# Patient Record
Sex: Female | Born: 1970 | Race: Black or African American | Hispanic: No | Marital: Single | Smoking: Current every day smoker
Health system: Southern US, Community
[De-identification: ages and names within clinical notes are randomized; demographics above are authoritative.]

## PROBLEM LIST (undated history)

## (undated) DIAGNOSIS — O009 Unspecified ectopic pregnancy without intrauterine pregnancy: Secondary | ICD-10-CM

## (undated) HISTORY — PX: ABDOMINAL HYSTERECTOMY: SHX81

---

## 2005-12-11 ENCOUNTER — Emergency Department: Payer: Self-pay | Admitting: Emergency Medicine

## 2011-07-21 ENCOUNTER — Emergency Department: Payer: Self-pay | Admitting: Emergency Medicine

## 2011-08-04 ENCOUNTER — Emergency Department: Payer: Self-pay | Admitting: Emergency Medicine

## 2012-06-03 ENCOUNTER — Emergency Department: Payer: Self-pay | Admitting: Emergency Medicine

## 2014-04-10 ENCOUNTER — Emergency Department: Payer: Self-pay | Admitting: Emergency Medicine

## 2014-04-10 LAB — CBC
HCT: 37.9 % (ref 35.0–47.0)
HGB: 12.8 g/dL (ref 12.0–16.0)
MCH: 31.5 pg (ref 26.0–34.0)
MCHC: 33.8 g/dL (ref 32.0–36.0)
MCV: 93 fL (ref 80–100)
PLATELETS: 280 10*3/uL (ref 150–440)
RBC: 4.06 10*6/uL (ref 3.80–5.20)
RDW: 12.4 % (ref 11.5–14.5)
WBC: 15.4 10*3/uL — ABNORMAL HIGH (ref 3.6–11.0)

## 2014-04-10 LAB — URINALYSIS, COMPLETE
Bilirubin,UR: NEGATIVE
Glucose,UR: NEGATIVE mg/dL (ref 0–75)
KETONE: NEGATIVE
Leukocyte Esterase: NEGATIVE
Nitrite: NEGATIVE
PROTEIN: NEGATIVE
Ph: 6 (ref 4.5–8.0)
RBC,UR: 1 /HPF (ref 0–5)
Specific Gravity: 1.005 (ref 1.003–1.030)
Squamous Epithelial: 4
WBC UR: 1 /HPF (ref 0–5)

## 2014-04-10 LAB — BASIC METABOLIC PANEL
Anion Gap: 6 — ABNORMAL LOW (ref 7–16)
BUN: 10 mg/dL (ref 7–18)
CHLORIDE: 105 mmol/L (ref 98–107)
CO2: 27 mmol/L (ref 21–32)
Calcium, Total: 9.1 mg/dL (ref 8.5–10.1)
Creatinine: 0.96 mg/dL (ref 0.60–1.30)
Glucose: 84 mg/dL (ref 65–99)
Osmolality: 274 (ref 275–301)
Potassium: 4.2 mmol/L (ref 3.5–5.1)
SODIUM: 138 mmol/L (ref 136–145)

## 2014-04-10 LAB — TROPONIN I

## 2016-02-14 DIAGNOSIS — M2550 Pain in unspecified joint: Secondary | ICD-10-CM | POA: Insufficient documentation

## 2016-02-14 DIAGNOSIS — F430 Acute stress reaction: Secondary | ICD-10-CM | POA: Insufficient documentation

## 2016-02-14 DIAGNOSIS — F5102 Adjustment insomnia: Secondary | ICD-10-CM | POA: Insufficient documentation

## 2016-02-14 DIAGNOSIS — E669 Obesity, unspecified: Secondary | ICD-10-CM | POA: Insufficient documentation

## 2016-03-07 ENCOUNTER — Other Ambulatory Visit: Payer: Self-pay | Admitting: Pediatrics

## 2016-03-07 DIAGNOSIS — Z1231 Encounter for screening mammogram for malignant neoplasm of breast: Secondary | ICD-10-CM

## 2016-03-08 ENCOUNTER — Ambulatory Visit
Admission: RE | Admit: 2016-03-08 | Discharge: 2016-03-08 | Disposition: A | Discharge status: Home / Self Care | Payer: BLUE CROSS/BLUE SHIELD | Source: Ambulatory Visit | Attending: Pediatrics | Admitting: Pediatrics

## 2016-03-08 DIAGNOSIS — Z1231 Encounter for screening mammogram for malignant neoplasm of breast: Secondary | ICD-10-CM | POA: Insufficient documentation

## 2016-10-12 ENCOUNTER — Ambulatory Visit
Admission: EM | Admit: 2016-10-12 | Discharge: 2016-10-12 | Disposition: A | Discharge status: Home / Self Care | Payer: BLUE CROSS/BLUE SHIELD | Attending: Family | Admitting: Family

## 2016-10-12 DIAGNOSIS — K047 Periapical abscess without sinus: Secondary | ICD-10-CM | POA: Diagnosis not present

## 2016-10-12 HISTORY — DX: Unspecified ectopic pregnancy without intrauterine pregnancy: O00.90

## 2016-10-12 MED ORDER — NAPROXEN 500 MG PO TABS: 500.0000 mg | ORAL_TABLET | Freq: Two times a day (BID) | ORAL | 0 refills | Status: DC | PRN

## 2016-10-12 MED ORDER — AMOXICILLIN-POT CLAVULANATE 875-125 MG PO TABS: 1.0000 | ORAL_TABLET | Freq: Two times a day (BID) | ORAL | 0 refills | Status: AC

## 2016-10-12 NOTE — ED Triage Notes (Signed)
Patient states that she woke up this morning with the left side of her face swollen. Patient states that she has noticed some redness around a tooth but has not had a lot of pain with the tooth.

## 2016-10-12 NOTE — ED Provider Notes (Signed)
CSN: [id number]     Arrival date & time 10/12/16  1218 History   First MD Initiated Contact with Patient 10/12/16 1248     Chief Complaint  Patient presents with  . Facial Swelling   (Consider location/radiation/quality/duration/timing/severity/associated sxs/prior Treatment) 45 year old female presents with left sided facial swelling and pain that started this morning. Her teeth are in poor repair and she is uncertain if there is redness/infection along her gumline on her left lower molar area. She is also having some nasal congestion and pain traveling from her ear to her lip on the left side. She has taken Ibuprofen with some relief. She denies any fever or GI symptoms. She has not been to a dentist in over 2 years. Her last experience did not go well and they pulled multiple teeth on her left side. She smokes daily but has no other chronic health issues and takes no daily medication.    The history is provided by the patient.    Past Medical History:  Diagnosis Date  . Ectopic pregnancy    Past Surgical History:  Procedure Laterality Date  . ABDOMINAL HYSTERECTOMY    . CESAREAN SECTION     History reviewed. No pertinent family history. Social History  Substance Use Topics  . Smoking status: Current Every Day Smoker    Packs/day: 1.00    Types: Cigars  . Smokeless tobacco: Never Used  . Alcohol use No   OB History    No data available     Review of Systems  Constitutional: Positive for appetite change. Negative for chills, fatigue and fever.  HENT: Positive for congestion, dental problem, facial swelling and postnasal drip. Negative for ear discharge, ear pain, rhinorrhea, sinus pain, sinus pressure, sneezing and sore throat.   Respiratory: Negative for cough and shortness of breath.   Gastrointestinal: Negative for diarrhea, nausea and vomiting.  Musculoskeletal: Negative for neck pain and neck stiffness.  Skin: Negative for rash.  Neurological: Positive for headaches.  Negative for weakness, light-headedness and numbness.  Hematological: Positive for adenopathy.    Allergies  Patient has no known allergies.  Home Medications   Prior to Admission medications   Medication Sig Start Date End Date Taking? Authorizing Provider  amoxicillin-clavulanate (AUGMENTIN) 875-125 MG tablet Take 1 tablet by mouth every 12 (twelve) hours. 10/12/16 10/22/16  Sudie GrumblingAnn Berry Myrl Bynum, NP  naproxen (NAPROSYN) 500 MG tablet Take 1 tablet (500 mg total) by mouth 2 (two) times daily as needed for moderate pain. 10/12/16   Sudie GrumblingAnn Berry Ardie Mclennan, NP   Meds Ordered and Administered this Visit  Medications - No data to display  BP (!) 155/88 (BP Location: Left Arm)   Pulse 100   Temp 98.2 F (36.8 C) (Oral)   Resp 17   Ht 5\' 4"  (1.626 m)   Wt 230 lb (104.3 kg)   SpO2 100%   BMI 39.48 kg/m  No data found.   Physical Exam  Constitutional: She is oriented to person, place, and time. She appears well-developed and well-nourished. No distress.  HENT:  Head: Normocephalic and atraumatic.  Right Ear: Hearing, tympanic membrane, external ear and ear canal normal.  Left Ear: Hearing, tympanic membrane, external ear and ear canal normal.  Nose: Rhinorrhea present. Right sinus exhibits no maxillary sinus tenderness and no frontal sinus tenderness. Left sinus exhibits no maxillary sinus tenderness and no frontal sinus tenderness.  Mouth/Throat: Uvula is midline, oropharynx is clear and moist and mucous membranes are normal. Abnormal dentition. Dental abscesses  and dental caries present. No uvula swelling. No posterior oropharyngeal erythema.    Redness and swelling below and lateral to 1st molar on left lower area. Very tender. No distinct discharge but possible pus present deep below gumline. Multiple teeth missing on left side- upper and lower areas and teeth in poor repair on right side.   Neck: Normal range of motion. Neck supple.  Cardiovascular: Normal rate, regular rhythm and normal  heart sounds.   Pulmonary/Chest: Effort normal and breath sounds normal. No respiratory distress. She has no decreased breath sounds. She has no wheezes.  Lymphadenopathy:       Head (left side): Submandibular adenopathy present.    She has cervical adenopathy.       Right cervical: No superficial cervical and no deep cervical adenopathy present.      Left cervical: Superficial cervical adenopathy present. No deep cervical adenopathy present.  Neurological: She is alert and oriented to person, place, and time. No cranial nerve deficit or sensory deficit.  Skin: Skin is warm and dry.  Psychiatric: She has a normal mood and affect. Her behavior is normal. Judgment and thought content normal.    Urgent Care Course   Clinical Course     Procedures (including critical care time)  Labs Review Labs Reviewed - No data to display  Imaging Review No results found.   Visual Acuity Review  Right Eye Distance:   Left Eye Distance:   Bilateral Distance:    Right Eye Near:   Left Eye Near:    Bilateral Near:         MDM   1. Dental infection    Discussed that she may have an abscess- recommend start Augmentin 875mg  twice a day for 10 days. Take Naproxen 500mg  every 12 hours as needed for pain. Apply cool compresses to area for comfort. Information provided on 2 local dentists- recommend she call today to schedule appointment for follow-up for next week. If symptoms or pain worsens, go to ER.     Sudie GrumblingAnn Berry Ayven Pheasant, NP 10/13/16 [phone]

## 2016-10-12 NOTE — Discharge Instructions (Signed)
Start Augmentin twice a day as directed. May use Naproxen every 12 hours as needed for pain. May apply cool compresses to area for comfort. Follow-up with a dentist next week.

## 2017-03-23 ENCOUNTER — Ambulatory Visit
Admission: EM | Admit: 2017-03-23 | Discharge: 2017-03-23 | Disposition: A | Discharge status: Home / Self Care | Payer: BLUE CROSS/BLUE SHIELD | Attending: Family Medicine | Admitting: Family Medicine

## 2017-03-23 DIAGNOSIS — A599 Trichomoniasis, unspecified: Secondary | ICD-10-CM

## 2017-03-23 DIAGNOSIS — L235 Allergic contact dermatitis due to other chemical products: Secondary | ICD-10-CM

## 2017-03-23 LAB — URINALYSIS, COMPLETE (UACMP) WITH MICROSCOPIC
Bilirubin Urine: NEGATIVE
Glucose, UA: NEGATIVE mg/dL
Ketones, ur: NEGATIVE mg/dL
Nitrite: NEGATIVE
PROTEIN: NEGATIVE mg/dL
SPECIFIC GRAVITY, URINE: 1.025 (ref 1.005–1.030)
pH: 6.5 (ref 5.0–8.0)

## 2017-03-23 MED ORDER — TRIAMCINOLONE ACETONIDE 0.1 % EX CREA: 1.0000 "application " | TOPICAL_CREAM | Freq: Two times a day (BID) | CUTANEOUS | 0 refills | Status: DC

## 2017-03-23 MED ORDER — METRONIDAZOLE 500 MG PO TABS: ORAL_TABLET | ORAL | 0 refills | Status: DC

## 2017-03-23 NOTE — ED Triage Notes (Signed)
Patient complains of she has noticed hematuria this morning and has noticed burning with urination that this started 2 days. Patient states that she was put on amoxicillin for teeth and has noticed yeast like discharge. Patient also reports headache.

## 2017-03-23 NOTE — ED Provider Notes (Signed)
CSN: [id number]     Arrival date & time 03/23/17  1612 History   First MD Initiated Contact with Patient 03/23/17 1733     Chief Complaint  Patient presents with  . Hematuria   (Consider location/radiation/quality/duration/timing/severity/associated sxs/prior Treatment) HPI  This is a 46 year old female who presents with "hematuria" that she noticed this morning. She states that she had blood on toilet tissue after she wiped herself do not have any frank blood in the toilet bowl. Had dysuria started about 2 days ago. She recently was placed on Augmentin by her dentist because of a infected tooth. He treated her for 7 days she has taken 5 but discontinued it because of a possible "yeast infection. She also has noticed irritation of the vaginal area she noticed after using warming KY jelly. She has noticed a white discharge. She is Sexually active in a monogamous relationship with a female partner. She has not had any further bleeding on toilet tissue since this morning.    Past Medical History:  Diagnosis Date  . Ectopic pregnancy    Past Surgical History:  Procedure Laterality Date  . ABDOMINAL HYSTERECTOMY    . CESAREAN SECTION     History reviewed. No pertinent family history. Social History  Substance Use Topics  . Smoking status: Current Every Day Smoker    Packs/day: 1.00    Types: Cigars  . Smokeless tobacco: Never Used  . Alcohol use No   OB History    No data available     Review of Systems  Constitutional: Positive for activity change. Negative for chills, fatigue and fever.  Genitourinary: Positive for dysuria, genital sores, hematuria and vaginal discharge. Negative for frequency and urgency.  All other systems reviewed and are negative.   Allergies  Patient has no known allergies.  Home Medications   Prior to Admission medications   Medication Sig Start Date End Date Taking? Authorizing Provider  metroNIDAZOLE (FLAGYL) 500 MG tablet Take 4 tablets (2 g) in one  dose. 03/23/17   Lutricia Feiloemer, Hassel Uphoff P, PA-C  naproxen (NAPROSYN) 500 MG tablet Take 1 tablet (500 mg total) by mouth 2 (two) times daily as needed for moderate pain. 10/12/16   Sudie GrumblingAmyot, Ann Berry, NP  triamcinolone cream (KENALOG) 0.1 % Apply 1 application topically 2 (two) times daily. 03/23/17   Lutricia Feiloemer, Katee Wentland P, PA-C   Meds Ordered and Administered this Visit  Medications - No data to display  BP (!) 138/92 (BP Location: Left Arm)   Pulse 87   Temp 98.2 F (36.8 C) (Oral)   Resp 18   Ht 5\' 4"  (1.626 m)   Wt 235 lb (106.6 kg)   SpO2 100%   BMI 40.34 kg/m  No data found.   Physical Exam  Constitutional: She is oriented to person, place, and time. She appears well-developed and well-nourished. No distress.  HENT:  Head: Normocephalic.  Eyes: Pupils are equal, round, and reactive to light. Right eye exhibits no discharge. Left eye exhibits no discharge.  Neck: Normal range of motion.  Genitourinary:  Genitourinary Comments: Patient declined pelvic exam.  Musculoskeletal: Normal range of motion.  Neurological: She is alert and oriented to person, place, and time.  Skin: Skin is warm and dry. She is not diaphoretic.  Psychiatric: She has a normal mood and affect. Her behavior is normal. Judgment and thought content normal.  Nursing note and vitals reviewed.   Urgent Care Course     Procedures (including critical care time)  Labs Review Labs  Reviewed  URINALYSIS, COMPLETE (UACMP) WITH MICROSCOPIC - Abnormal; Notable for the following:       Result Value   APPearance HAZY (*)    Hgb urine dipstick TRACE (*)    Leukocytes, UA SMALL (*)    Squamous Epithelial / LPF 6-30 (*)    Bacteria, UA FEW (*)    All other components within normal limits    Imaging Review No results found.   Visual Acuity Review  Right Eye Distance:   Left Eye Distance:   Bilateral Distance:    Right Eye Near:   Left Eye Near:    Bilateral Near:         MDM   1. Trichimoniasis   2.  Chemical induced allergic contact dermatitis    Discharge Medication List as of 03/23/2017  6:44 PM    START taking these medications   Details  metroNIDAZOLE (FLAGYL) 500 MG tablet Take 4 tablets (2 g) in one dose., Normal    triamcinolone cream (KENALOG) 0.1 % Apply 1 application topically 2 (two) times daily., Starting Fri 03/23/2017, Normal      Plan: 1. Test/x-ray results and diagnosis reviewed with patient 2. rx as per orders; risks, benefits, potential side effects reviewed with patient 3. Recommend supportive treatment withTreatment for trichomoniasis with Flagyl. Recommended that she restrain from a intercourse until her partner is also treated. She should not drink alcohol on the medications. It is possible she has a contact dermatitis from the KY jelly that is causing her irritation of the vulva. Give her a prescription for triamcinolone cream to use topically. She should discontinue the use of the KY jelly immediately. If she is not improving in approximately a week she should be seen at our clinic or  with a primary care physician for pelvic for definitive diagnosis. Patient did not want to undergo a pelvic exam today. 4. F/u prn if symptoms worsen or don't improve     Lutricia Feil, PA-C 03/23/17 1900    Lutricia Feil, PA-C 03/23/17 1902

## 2017-08-13 ENCOUNTER — Ambulatory Visit (INDEPENDENT_AMBULATORY_CARE_PROVIDER_SITE_OTHER): Payer: BLUE CROSS/BLUE SHIELD

## 2017-08-13 ENCOUNTER — Ambulatory Visit (INDEPENDENT_AMBULATORY_CARE_PROVIDER_SITE_OTHER): Payer: BLUE CROSS/BLUE SHIELD | Admitting: Podiatry

## 2017-08-13 VITALS — BP 132/88 | HR 102 | Temp 98.2°F | Resp 18

## 2017-08-13 DIAGNOSIS — M722 Plantar fascial fibromatosis: Secondary | ICD-10-CM

## 2017-08-13 MED ORDER — MELOXICAM 15 MG PO TABS: 15.0000 mg | ORAL_TABLET | Freq: Every day | ORAL | 3 refills | Status: DC

## 2017-08-13 NOTE — Addendum Note (Signed)
Addended by: Geraldine ContrasVENABLE, Lashane Whelpley D on: 08/13/2017 03:40 PM   Modules accepted: Orders

## 2017-08-13 NOTE — Progress Notes (Signed)
   Subjective:    Patient ID: Makayla Pena, female    DOB: 1971-02-12, 46 y.o.   MRN: [id number]  HPIThis patient presents to the office saying she is experiencing severe pain right heel.  She says the pain has been on and off for 3 years but this pain has been severe and constant.  She says she has pain upon rising.  She even says she experiences burning in her right heel.  She has provided no self treatment.  She presents to the office for evaluation of her right heel pain. She has taken ibuprofen with minimal relief    Review of Systems  Musculoskeletal: Positive for gait problem.  All other systems reviewed and are negative.      Objective:   Physical Exam General Appearance  Alert, conversant and in no acute stress.  Vascular  Dorsalis pedis and posterior pulses are palpable  bilaterally.  Capillary return is within normal limits  Bilaterally. Temperature is within normal limits  Bilaterally  Neurologic  Senn-Weinstein monofilament wire test within normal limits  bilaterally. Muscle power  Within normal limits bilaterally.  Nails Thick disfigured discolored nails with subungual debride bilaterally from hallux to fifth toes bilaterally. No evidence of bacterial infection or drainage bilaterally.  Orthopedic  No limitations of motion of motion feet bilaterally.  No crepitus or effusions noted.  No bony pathology or digital deformities noted. Palpable pain and swelling upon palpation plantar fascia right foot.  Skin  normotropic skin with no porokeratosis noted bilaterally.  No signs of infections or ulcers noted.          Assessment & Plan:  Plantar fascitis right foot.  IE  X-rays reveal calcification at the insertion of the plantar fascia and the insertion of the Achilles tendon, right foot.  Patient was diagnosed with plantar fasciitis and due to her having problems with injections. She declined an injection at today's visit.  Recommended physical therapy as well as  stretching for this patient.  Recommended power step insoles to be worn.  Patient to be prescribed Mobic.  RTC 2 weeks.   Helane GuntherGregory Morocco Gipe DPM

## 2017-08-27 ENCOUNTER — Ambulatory Visit: Payer: BLUE CROSS/BLUE SHIELD | Admitting: Podiatry

## 2017-11-09 ENCOUNTER — Other Ambulatory Visit: Payer: Self-pay

## 2017-11-09 ENCOUNTER — Ambulatory Visit
Admission: EM | Admit: 2017-11-09 | Discharge: 2017-11-09 | Disposition: A | Discharge status: Home / Self Care | Payer: BLUE CROSS/BLUE SHIELD | Attending: Emergency Medicine | Admitting: Emergency Medicine

## 2017-11-09 ENCOUNTER — Encounter: Payer: Self-pay | Admitting: Emergency Medicine

## 2017-11-09 DIAGNOSIS — M791 Myalgia, unspecified site: Secondary | ICD-10-CM | POA: Diagnosis not present

## 2017-11-09 DIAGNOSIS — J029 Acute pharyngitis, unspecified: Secondary | ICD-10-CM

## 2017-11-09 DIAGNOSIS — R05 Cough: Secondary | ICD-10-CM

## 2017-11-09 DIAGNOSIS — J111 Influenza due to unidentified influenza virus with other respiratory manifestations: Secondary | ICD-10-CM | POA: Diagnosis not present

## 2017-11-09 LAB — RAPID INFLUENZA A&B ANTIGENS
Influenza A (ARMC): NEGATIVE
Influenza B (ARMC): NEGATIVE

## 2017-11-09 MED ORDER — GUAIFENESIN-CODEINE 100-10 MG/5ML PO SYRP: 10.0000 mL | ORAL_SOLUTION | Freq: Four times a day (QID) | ORAL | 0 refills | Status: AC | PRN

## 2017-11-09 MED ORDER — IBUPROFEN 600 MG PO TABS: 600.0000 mg | ORAL_TABLET | Freq: Four times a day (QID) | ORAL | 0 refills | Status: AC | PRN

## 2017-11-09 MED ORDER — OSELTAMIVIR PHOSPHATE 75 MG PO CAPS: 75.0000 mg | ORAL_CAPSULE | Freq: Two times a day (BID) | ORAL | 0 refills | Status: AC

## 2017-11-09 MED ORDER — FLUTICASONE PROPIONATE 50 MCG/ACT NA SUSP: 2.0000 | Freq: Every day | NASAL | 0 refills | Status: AC

## 2017-11-09 MED ORDER — BENZONATATE 200 MG PO CAPS: 200.0000 mg | ORAL_CAPSULE | Freq: Three times a day (TID) | ORAL | 0 refills | Status: AC | PRN

## 2017-11-09 NOTE — ED Provider Notes (Signed)
HPI  SUBJECTIVE:  Makayla Pena is a 47 y.o. female who presents with the abrupt onset of productive cough cough, body aches, sore throat, feeling feverish starting yesterday no documented fevers that she did not take her temperature at home.  She reports a gradual onset diffuse throbbing headache.  She reports nasal congestion, postnasal drip.  No rhinorrhea, sinus pain or pressure, dental pain, ear pain.  No photophobia, neck stiffness, visual changes.  No abdominal pain, urinary complaints, diarrhea, rash.  States that she is unable to sleep at night secondary to the cough.  She tried ibuprofen 800 mg without improvement in her symptoms.  No other aggravating or alleviating factors.  She states that 2 of her coworkers have flu and she was sent in by her boss to be evaluated for this.  She has no past medical history of diabetes, hypertension, HIV, immunocompromise, asthma, eczema, COPD.  She occasionally smokes black in miles.  LMP: Status post hysterectomy.  PMD: None.   Past Medical History:  Diagnosis Date  . Ectopic pregnancy     Past Surgical History:  Procedure Laterality Date  . ABDOMINAL HYSTERECTOMY    . CESAREAN SECTION      Family History  Problem Relation Age of Onset  . Renal Disease Mother   . Diabetes Mother   . Hypertension Mother   . Cancer Father        prostate  . Diabetes Father     Social History   Tobacco Use  . Smoking status: Current Every Day Smoker    Packs/day: 1.00    Types: Cigars  . Smokeless tobacco: Never Used  Substance Use Topics  . Alcohol use: No  . Drug use: No    No current facility-administered medications for this encounter.   Current Outpatient Medications:  .  benzonatate (TESSALON) 200 MG capsule, Take 1 capsule (200 mg total) by mouth 3 (three) times daily as needed for cough., Disp: 30 capsule, Rfl: 0 .  fluticasone (FLONASE) 50 MCG/ACT nasal spray, Place 2 sprays into both nostrils daily., Disp: 16 g, Rfl: 0 .   guaiFENesin-codeine (CHERATUSSIN AC) 100-10 MG/5ML syrup, Take 10 mLs by mouth 4 (four) times daily as needed for cough., Disp: 120 mL, Rfl: 0 .  ibuprofen (ADVIL,MOTRIN) 600 MG tablet, Take 1 tablet (600 mg total) by mouth every 6 (six) hours as needed., Disp: 30 tablet, Rfl: 0 .  oseltamivir (TAMIFLU) 75 MG capsule, Take 1 capsule (75 mg total) by mouth 2 (two) times daily. X 5 days, Disp: 10 capsule, Rfl: 0  No Known Allergies   ROS  As noted in HPI.   Physical Exam  BP (!) 135/91 (BP Location: Left Arm)   Pulse (!) 116   Temp 98.2 F (36.8 C) (Oral)   Resp 16   Ht 5\' 4"  (1.626 m)   Wt 235 lb (106.6 kg)   SpO2 100%   BMI 40.34 kg/m   Constitutional: Well developed, well nourished, no acute distress Eyes:  EOMI, conjunctiva normal bilaterally HENT: Normocephalic, atraumatic,mucus membranes moist.  Clear rhinorrhea.  Red turbinates, but not swollen.  Positive mild maxillary and frontal sinus tenderness.  Oropharynx normal.  Normal tonsils.  Positive cobblestoning.  Positive postnasal drip. Neck: no cervical lymphadenopathy.  No meningismus. Respiratory: Normal inspiratory effort, lungs clear bilaterally. Cardiovascular: Regular tachycardia no murmurs, rubs, gallops GI: Obese, soft, nontender, nondistended.  Active bowel sounds.  No suprapubic, flank tenderness. Back: No CVAT skin: No rash, skin intact Musculoskeletal: no deformities  Neurologic: Alert & oriented x 3, no focal neuro deficits Psychiatric: Speech and behavior appropriate   ED Course   Medications - No data to display  Orders Placed This Encounter  Procedures  . Rapid Influenza A&B Antigens ([facility] only)    Standing Status:   Standing    Number of Occurrences:   1  . Droplet precaution    Standing Status:   Standing    Number of Occurrences:   1    Results for orders placed or performed during the hospital encounter of 11/09/17 (from the past 24 hour(s))  Rapid Influenza A&B Antigens ([facility] only)      Status: None   Collection Time: 11/09/17 11:49 AM  Result Value Ref Range   Influenza A ([facility]) NEGATIVE NEGATIVE   Influenza B ([facility]) NEGATIVE NEGATIVE   No results found.  ED Clinical Impression  Influenza   ED Assessment/Plan  Rapid flu negative, but clinically she has the flu, particularly with the tachycardia.  This could be an early sinus infection as she does have some sinus tenderness however she does not complain of sinus pain or pressure or upper dental pain.  Plan to send home with Tamiflu, Cheratussin Tessalon, Mucinex D, Flonase, saline nasal irrigation.  Will provide a primary care referral for routine care.  We will also provide a primary care list.  [place] Narcotic database reviewed for this patient, and feel that the risk/benefit ratio today is favorable for proceeding with a prescription for controlled substance.  Patient states that hydrocodone and oxycodone make her sick and would like prescription for codeine instead.  Discussed labs,  MDM, plan and followup with patient. Discussed sn/sx that should prompt return to the ED. patient agrees with plan.   Meds ordered this encounter  Medications  . fluticasone (FLONASE) 50 MCG/ACT nasal spray    Sig: Place 2 sprays into both nostrils daily.    Dispense:  16 g    Refill:  0  . guaiFENesin-codeine (CHERATUSSIN AC) 100-10 MG/5ML syrup    Sig: Take 10 mLs by mouth 4 (four) times daily as needed for cough.    Dispense:  120 mL    Refill:  0  . oseltamivir (TAMIFLU) 75 MG capsule    Sig: Take 1 capsule (75 mg total) by mouth 2 (two) times daily. X 5 days    Dispense:  10 capsule    Refill:  0  . benzonatate (TESSALON) 200 MG capsule    Sig: Take 1 capsule (200 mg total) by mouth 3 (three) times daily as needed for cough.    Dispense:  30 capsule    Refill:  0  . ibuprofen (ADVIL,MOTRIN) 600 MG tablet    Sig: Take 1 tablet (600 mg total) by mouth every 6 (six) hours as needed.    Dispense:  30 tablet    Refill:  0     *This clinic note was created using Scientist, clinical (histocompatibility and immunogenetics)Dragon dictation software. Therefore, there may be occasional mistakes despite careful proofreading.   ?    Domenick GongMortenson, Trystan Eads, MD 11/09/17 1328

## 2017-11-09 NOTE — Discharge Instructions (Signed)
Take the medication as written. Take 1 gram of tylenol with the 600 mg motrin up to 3-4 times a day as needed for pain and fever. This is an effective combination. Drink extra fluids. Start taking mucinex-d to keep the mucus secretions thin. Use a neti pot or the NeilMed sinus rinse as often as you want to to reduce nasal congestion. Follow the directions on the box. Here is a list of primary care providers who are taking new patients:  Dr. Elizabeth Sauereanna Jones, Dr. Schuyler AmorWilliam Plonk [address] [place] [address] [phone]  [facility] [place] [address]  [place] [address]  [phone]  [facility] [address]  [place], [place] [address] [phone]  [facility] [place] [address]  [phone] [place], [place] [address]  Here are clinics/ other resources who will see you if you do not have insurance. Some have certain criteria that you must meet. Call them and find out what they are:  [facility]: [address] [place], [place] [address] Phone: [phone] Hours: First and Third Saturdays of each Month, 9 a.m. - 1 p.m.  [facility]: [address], [place], [place] [address] Phone: [phone] Hours: Tuesday, 4 p.m. - 8 p.m. Thursday, 1 p.m. - 8 p.m. Wednesday, 9 a.m. - [facility]  [facility] [address], [place], [place] [address] Phone: [phone] Pharmacy Phone Number: [phone] Dental Phone Number: [phone] [facility] Insurance Help: [phone]  Dental Hours: Monday - Thursday, 8 a.m. - 6 p.m.  [facility] [address] [place], [place] [address] Phone: [phone] Pharmacy Phone Number: [phone] [facility] Insurance Help: [phone]  [facility] [address] [place], [place] [address] Phone: [phone] Pharmacy Phone Number: [phone] [facility] Insurance Help: [phone]  [facility] [address] [place], [place] [address] Phone: [phone] [facility] Insurance Help: [phone]   [facility] [address] [place], [place] [address] Phone: [phone]  Go to www.goodrx.com to look up your medications. This will give you a list of where you can find your prescriptions at the most affordable prices. Or ask the pharmacist what the cash price is, or if they have any other discount programs available to help make your medication more affordable. This can be less expensive than what you would pay with insurance.

## 2017-11-09 NOTE — ED Triage Notes (Signed)
Patient in today c/o cough, headache and sore throat x 2 days. Patient denies fever, but hasn't taken temperature. Patient has been taking Ibuprofen for the headache. Patient states that 2 of her co-workers has the flu and her boss wanted to make sure she didn't have it too.

## 2017-11-12 ENCOUNTER — Telehealth: Payer: Self-pay | Admitting: *Deleted

## 2017-11-12 NOTE — Telephone Encounter (Signed)
Called patient, verified DOB, patient reported feeling better. Advised patient to follow up with PCP or [facility] if symptoms return. 

## 2018-05-10 ENCOUNTER — Encounter: Payer: Self-pay | Admitting: Emergency Medicine

## 2018-05-10 ENCOUNTER — Ambulatory Visit
Admission: EM | Admit: 2018-05-10 | Discharge: 2018-05-10 | Disposition: A | Discharge status: Home / Self Care | Payer: BLUE CROSS/BLUE SHIELD | Attending: Family Medicine | Admitting: Family Medicine

## 2018-05-10 ENCOUNTER — Other Ambulatory Visit: Payer: Self-pay

## 2018-05-10 DIAGNOSIS — R51 Headache: Secondary | ICD-10-CM

## 2018-05-10 DIAGNOSIS — R220 Localized swelling, mass and lump, head: Secondary | ICD-10-CM | POA: Diagnosis not present

## 2018-05-10 DIAGNOSIS — K130 Diseases of lips: Secondary | ICD-10-CM | POA: Diagnosis not present

## 2018-05-10 NOTE — ED Triage Notes (Signed)
Patient in today c/o headache since last night. Patient took 2 Ibuprofen and went to sleep. Headache was still there when she woke up. Patient also states that her lip and right cheek are swollen this morning. Patient states that she was stung by a bee on 05/04/18 and did have some swelling, but it resolved on Sunday. Patient states she does have some sinus congestion. Patient denies tooth pain.

## 2018-05-10 NOTE — ED Provider Notes (Signed)
[facility]    CSN: [id number] Arrival date & time: 05/10/18  [id number]     History   Chief Complaint Chief Complaint  Patient presents with  . Headache    HPI Earlie Countserrie E Baldyga is a 47 y.o. female.   47 yo female with a c/o headache since yesterday evening and right lower lip swelling that started later in the evening after she had taken 2 ibuprofen. Patient states she woke up this morning with worse right lip swelling, which is now currently improved, and right cheek facial swelling. Patient denies any wheezing, difficulty breathing, fevers, chills, chest pain, difficulty swallowing or vision changes. States that last Saturday (7 days ago) she was stung by a wasp on the lip which got swollen but was resolved by the next day. Patient denies taking any other otc or prescription medications; also denies any new foods, soaps, detergents, chemical exposures or other possible allergy triggers.   The history is provided by the patient.  Headache    Past Medical History:  Diagnosis Date  . Ectopic pregnancy     Patient Active Problem List   Diagnosis Date Noted  . Acute stress reaction 02/14/2016  . Adjustment insomnia 02/14/2016  . Arthralgia 02/14/2016  . Mild obesity 02/14/2016    Past Surgical History:  Procedure Laterality Date  . ABDOMINAL HYSTERECTOMY    . CESAREAN SECTION      OB History   None      Home Medications    Prior to Admission medications   Medication Sig Start Date End Date Taking? Authorizing Provider  benzonatate (TESSALON) 200 MG capsule Take 1 capsule (200 mg total) by mouth 3 (three) times daily as needed for cough. 11/09/17   Domenick GongMortenson, Ashley, MD  fluticasone (FLONASE) 50 MCG/ACT nasal spray Place 2 sprays into both nostrils daily. 11/09/17   Domenick GongMortenson, Ashley, MD  guaiFENesin-codeine (CHERATUSSIN AC) 100-10 MG/5ML syrup Take 10 mLs by mouth 4 (four) times daily as needed for cough. 11/09/17   Domenick GongMortenson, Ashley, MD  ibuprofen  (ADVIL,MOTRIN) 600 MG tablet Take 1 tablet (600 mg total) by mouth every 6 (six) hours as needed. 11/09/17   Domenick GongMortenson, Ashley, MD  oseltamivir (TAMIFLU) 75 MG capsule Take 1 capsule (75 mg total) by mouth 2 (two) times daily. X 5 days 11/09/17   Domenick GongMortenson, Ashley, MD    Family History Family History  Problem Relation Age of Onset  . Renal Disease Mother   . Diabetes Mother   . Hypertension Mother   . Cancer Father        prostate  . Diabetes Father     Social History Social History   Tobacco Use  . Smoking status: Current Every Day Smoker    Packs/day: 1.00    Types: Cigars  . Smokeless tobacco: Never Used  Substance Use Topics  . Alcohol use: No  . Drug use: No     Allergies   Patient has no known allergies.   Review of Systems Review of Systems  Neurological: Positive for headaches.     Physical Exam Triage Vital Signs ED Triage Vitals [05/10/18 0842]  Enc Vitals Group     BP (!) 147/100     Pulse Rate (!) 102     Resp 16     Temp 98.3 F (36.8 C)     Temp Source Oral     SpO2 100 %     Weight 235 lb (106.6 kg)     Height 5\' 4"  (1.626 m)  Head Circumference      Peak Flow      Pain Score 8     Pain Loc      Pain Edu?      Excl. in GC?    No data found.  Updated Vital Signs BP (!) 147/100 (BP Location: Left Arm)   Pulse (!) 102   Temp 98.3 F (36.8 C) (Oral)   Resp 16   Ht 5\' 4"  (1.626 m)   Wt 235 lb (106.6 kg)   SpO2 100%   BMI 40.34 kg/m   Visual Acuity Right Eye Distance:   Left Eye Distance:   Bilateral Distance:    Right Eye Near:   Left Eye Near:    Bilateral Near:     Physical Exam   UC Treatments / Results  Labs (all labs ordered are listed, but only abnormal results are displayed) Labs Reviewed - No data to display  EKG None  Radiology No results found.  Procedures Procedures (including critical care time)  Medications Ordered in UC Medications - No data to display  Initial Impression / Assessment and  Plan / UC Course  I have reviewed the triage vital signs and the nursing notes.  Pertinent labs & imaging results that were available during my care of the patient were reviewed by me and considered in my medical decision making (see chart for details).      Final Clinical Impressions(s) / UC Diagnoses   Final diagnoses:  Lip swelling   Discharge Instructions   None    ED Prescriptions    None     Patient left AMA.   After interviewing patient and prior to examination, I informed the patient that this could possibly be from the ibuprofen that she took last night. Patient suddenly became upset and stated "I don't like that", "I don't like what you're telling me", "this is not from the ibuprofen and I need to take ibuprofen for my arthritis". Patient then proceed to say "I want to see someone else, because I don't like what you're telling me about the ibuprofen". I informed the patient, that was fine and I would check to see if the other provider would see her, but it might be a little bit longer wait. When the patient was informed by the RN Gunnar Fusi) that the other provider could see her but it would be a little wait since she was with another patient, the patient stated she couldn't wait and left AMA.   Controlled Substance Prescriptions [place] Controlled Substance Registry consulted? Not Applicable   Payton Mccallum, MD 05/10/18 [phone]

## 2020-06-07 ENCOUNTER — Ambulatory Visit: Payer: Self-pay | Attending: Internal Medicine

## 2020-06-07 DIAGNOSIS — Z23 Encounter for immunization: Secondary | ICD-10-CM

## 2020-06-07 NOTE — Progress Notes (Signed)
   Covid-19 Vaccination Clinic  Name:  Makayla Pena    MRN: [id number] DOB: [birth year]  06/07/2020  [name] was observed post Covid-19 immunization for 15 minutes without incident. She was provided with Vaccine Information Sheet and instruction to access the V-Safe system.   [name] was instructed to call 911 with any severe reactions post vaccine: Marland Kitchen Difficulty breathing  . Swelling of face and throat  . A fast heartbeat  . A bad rash all over body  . Dizziness and weakness   Immunizations Administered    Name Date Dose VIS Date Route   Moderna COVID-19 Vaccine 06/07/2020  3:16 PM 0.5 mL 09/2019 Intramuscular   Manufacturer: Gala Murdoch   Lot: [id number]   NDC: 26834-196-22

## 2020-07-05 ENCOUNTER — Ambulatory Visit: Payer: Self-pay | Attending: Internal Medicine

## 2020-07-05 ENCOUNTER — Ambulatory Visit: Payer: Self-pay

## 2020-07-05 DIAGNOSIS — Z23 Encounter for immunization: Secondary | ICD-10-CM

## 2020-07-05 NOTE — Progress Notes (Signed)
   Covid-19 Vaccination Clinic  Name:  KRYSTI HICKLING    MRN: [id number] DOB: 23-Aug-1971  07/05/2020  Ms. Tostenson was observed post Covid-19 immunization for 15 minutes without incident. She was provided with Vaccine Information Sheet and instruction to access the V-Safe system.   Ms. Pokorney was instructed to call 911 with any severe reactions post vaccine: Marland Kitchen Difficulty breathing  . Swelling of face and throat  . A fast heartbeat  . A bad rash all over body  . Dizziness and weakness   Immunizations Administered    Name Date Dose VIS Date Route   Moderna COVID-19 Vaccine 07/05/2020  3:25 PM 0.5 mL 09/2019 Intramuscular   Manufacturer: Moderna   Lot: [id number]   NDC: 06269-485-46

## 2021-04-04 ENCOUNTER — Other Ambulatory Visit: Payer: Self-pay

## 2021-04-04 ENCOUNTER — Ambulatory Visit (LOCAL_COMMUNITY_HEALTH_CENTER): Payer: Self-pay

## 2021-04-04 DIAGNOSIS — Z111 Encounter for screening for respiratory tuberculosis: Secondary | ICD-10-CM

## 2021-04-07 ENCOUNTER — Ambulatory Visit (LOCAL_COMMUNITY_HEALTH_CENTER): Payer: Self-pay

## 2021-04-07 ENCOUNTER — Other Ambulatory Visit: Payer: Self-pay

## 2021-04-07 DIAGNOSIS — Z111 Encounter for screening for respiratory tuberculosis: Secondary | ICD-10-CM

## 2021-04-07 LAB — TB SKIN TEST
Induration: 0 mm
TB Skin Test: NEGATIVE

## 2021-04-07 NOTE — Progress Notes (Signed)
PPD result paper given to client. Jossie Ng, RN

## 2021-05-09 ENCOUNTER — Emergency Department
Admission: EM | Admit: 2021-05-09 | Discharge: 2021-05-09 | Disposition: A | Discharge status: Home / Self Care | Payer: Self-pay | Attending: Emergency Medicine | Admitting: Emergency Medicine

## 2021-05-09 ENCOUNTER — Other Ambulatory Visit: Payer: Self-pay

## 2021-05-09 DIAGNOSIS — F17210 Nicotine dependence, cigarettes, uncomplicated: Secondary | ICD-10-CM | POA: Insufficient documentation

## 2021-05-09 DIAGNOSIS — T7840XA Allergy, unspecified, initial encounter: Secondary | ICD-10-CM | POA: Insufficient documentation

## 2021-05-09 MED ORDER — ALBUTEROL SULFATE (2.5 MG/3ML) 0.083% IN NEBU
2.5000 mg | INHALATION_SOLUTION | RESPIRATORY_TRACT | Status: AC
  Administered 2021-05-09: 2.5 mg via RESPIRATORY_TRACT
  Filled 2021-05-09: qty 3

## 2021-05-09 MED ORDER — DIPHENHYDRAMINE HCL 50 MG/ML IJ SOLN
25.0000 mg | Freq: Once | INTRAMUSCULAR | Status: AC
  Administered 2021-05-09: 25 mg via INTRAVENOUS
  Filled 2021-05-09: qty 1

## 2021-05-09 MED ORDER — EPINEPHRINE 0.3 MG/0.3ML IJ SOAJ: 0.3000 mg | Freq: Once | INTRAMUSCULAR | 0 refills | Status: AC

## 2021-05-09 MED ORDER — PREDNISONE 50 MG PO TABS: ORAL_TABLET | ORAL | 0 refills | Status: AC

## 2021-05-09 MED ORDER — EPINEPHRINE 0.3 MG/0.3ML IJ SOAJ
0.3000 mg | Freq: Once | INTRAMUSCULAR | Status: AC
  Administered 2021-05-09: 0.3 mg via INTRAMUSCULAR
  Filled 2021-05-09: qty 0.3

## 2021-05-09 MED ORDER — ONDANSETRON HCL 4 MG/2ML IJ SOLN
4.0000 mg | INTRAMUSCULAR | Status: AC
  Administered 2021-05-09: 4 mg via INTRAVENOUS
  Filled 2021-05-09: qty 2

## 2021-05-09 MED ORDER — METHYLPREDNISOLONE SODIUM SUCC 125 MG IJ SOLR
125.0000 mg | INTRAMUSCULAR | Status: AC
  Administered 2021-05-09: 125 mg via INTRAVENOUS
  Filled 2021-05-09: qty 2

## 2021-05-09 MED ORDER — FAMOTIDINE 20 MG PO TABS
20.0000 mg | ORAL_TABLET | Freq: Once | ORAL | Status: AC
  Administered 2021-05-09: 20 mg via ORAL
  Filled 2021-05-09: qty 1

## 2021-05-09 MED ORDER — LORATADINE 10 MG PO TABS
10.0000 mg | ORAL_TABLET | Freq: Every day | ORAL | Status: DC
  Administered 2021-05-09: 10 mg via ORAL
  Filled 2021-05-09: qty 1

## 2021-05-09 MED ORDER — SODIUM CHLORIDE 0.9 % IV BOLUS
500.0000 mL | Freq: Once | INTRAVENOUS | Status: AC
  Administered 2021-05-09: 500 mL via INTRAVENOUS

## 2021-05-09 NOTE — ED Triage Notes (Signed)
Pt c/o SOB with rash all over that started about PTA, states no known allergies.

## 2021-05-09 NOTE — Discharge Instructions (Addendum)
You have been seen in the Emergency Department (ED) today for an allergic reaction.  You have been stable throughout your stay in the Emergency Department.  Please take your medications as prescribed and follow up with your doctor as indicated.  You should also take over-the-counter Zyrtec  for the next five days according to the dosing instructions on the package.  Please keep your Epi-Pen with you at all times and use it if experience shortness of breath or difficulty breathing or if you believe you are having a severe allergic reaction.  If you use the Epi-Pen, though, please call 911 afterwards or go immediately to your nearest Emergency Department.  Return to the Emergency Department (ED) if you experience any worsening or new symptoms that concern you.  Do not drive yourself today as medicine we gave you here can make you sleepy.

## 2021-05-09 NOTE — ED Provider Notes (Signed)
[facility] Emergency Department Provider Note ____________________________________________   Event Date/Time   First MD Initiated Contact with Patient 05/09/21 0815     (approximate)  I have reviewed the triage vital signs and the nursing notes.   HISTORY  Chief Complaint No chief complaint on file.  EM caveat: Acuity, concern for anaphylaxis and allergic acute allergic reaction  HPI Makayla Pena is a 50 y.o. female was getting up for work today was fine at 6 AM went to go bring her trash to the curb and started noticing that she became itchy in her hands.  She got herself in the car and was going to work but then noted that the itching was worsening across her forearms arms scalp back and abdomen with hives appearing.  This is never happened to her before.  She then started to develop mild shortness of breath.  A friend recommended she take 25 mg of Benadryl which she did prior to arrival.  Denies any pain no fever no headache.  In her normal state of health.  No known trigger or reaction.  Denies any sort of bug bite sting or sting.  She reports she takes no prescription medications and has not taken anything new or unusual in her diet.  Currently reports feeling mildly short of breath but does not feel any swelling in her mouth neck or throat.  Her tongue does not feel swollen.  She reports that she just feels a little bit heavy in her breathing but itching almost all over her body. Past Medical History:  Diagnosis Date   Ectopic pregnancy     Patient Active Problem List   Diagnosis Date Noted   Acute stress reaction 02/14/2016   Adjustment insomnia 02/14/2016   Arthralgia 02/14/2016   Mild obesity 02/14/2016    Past Surgical History:  Procedure Laterality Date   ABDOMINAL HYSTERECTOMY     CESAREAN SECTION      Prior to Admission medications   Medication Sig Start Date End Date Taking? Authorizing Provider  EPINEPHrine 0.3 mg/0.3 mL IJ SOAJ  injection Inject 0.3 mg into the muscle once for 1 dose. 05/09/21 05/09/21 Yes Sharyn Creamer, MD  predniSONE (DELTASONE) 50 MG tablet 1 tab by mouth daily 05/09/21  Yes Sharyn Creamer, MD  benzonatate (TESSALON) 200 MG capsule Take 1 capsule (200 mg total) by mouth 3 (three) times daily as needed for cough. Patient not taking: Reported on 04/04/2021 11/09/17   Domenick Gong, MD  fluticasone [facility]) 50 MCG/ACT nasal spray Place 2 sprays into both nostrils daily. Patient not taking: Reported on 04/04/2021 11/09/17   Domenick Gong, MD  guaiFENesin-codeine [facility]) 100-10 MG/5ML syrup Take 10 mLs by mouth 4 (four) times daily as needed for cough. Patient not taking: Reported on 04/04/2021 11/09/17   Domenick Gong, MD  ibuprofen (ADVIL,MOTRIN) 600 MG tablet Take 1 tablet (600 mg total) by mouth every 6 (six) hours as needed. Patient not taking: Reported on 04/04/2021 11/09/17   Domenick Gong, MD  loratadine (CLARITIN) 10 MG tablet Take 10 mg by mouth daily.    [provider]  oseltamivir (TAMIFLU) 75 MG capsule Take 1 capsule (75 mg total) by mouth 2 (two) times daily. X 5 days Patient not taking: Reported on 04/04/2021 11/09/17   Domenick Gong, MD  Patient is not taking any of the above medications currently  Allergies Patient has no known allergies.  Family History  Problem Relation Age of Onset   Renal Disease Mother  Diabetes Mother    Hypertension Mother    Cancer Father        prostate   Diabetes Father     Social History Social History   Tobacco Use   Smoking status: Every Day    Packs/day: 1.00    Types: Cigars, Cigarettes   Smokeless tobacco: Never  Vaping Use   Vaping Use: Never used  Substance Use Topics   Alcohol use: No   Drug use: No    Review of Systems Constitutional: No fever/chills Eyes: No visual changes. ENT: No sore throat or swollen throat Cardiovascular: Denies chest pain. Respiratory: Denies shortness of  breath. Gastrointestinal: No abdominal pain.  Some nausea. Musculoskeletal: Itching all over her arms and legs. Skin: Bumps and hives noted across her body started on her hands now on her forearms chest and back and also feels it in her scalp. Neurological: Negative for headaches or weakness.    ____________________________________________   PHYSICAL EXAM:  VITAL SIGNS: ED Triage Vitals  Enc Vitals Group     BP 05/09/21 0810 119/83     Pulse Rate 05/09/21 0810 (!) 119     Resp 05/09/21 0810 20     Temp --      Temp Source 05/09/21 0810 Oral     SpO2 05/09/21 0810 97 %     Weight 05/09/21 0811 230 lb (104.3 kg)     Height 05/09/21 0811 5\' 4"  (1.626 m)     Head Circumference --      Peak Flow --      Pain Score 05/09/21 0811 0     Pain Loc --      Pain Edu? --      Excl. in GC? --     Constitutional: Alert and oriented.  Appears somewhat anxious.  She is itching all over especially on her forearms. Eyes: Conjunctivae are normal. Head: Atraumatic. Nose: No congestion/rhinnorhea. Mouth/Throat: Mucous membranes are moist.  Posterior oropharynx is clear without evidence of edema.  The tongue is normal in size.  Floor the mouth is normal.  The patient does have fairly poor dentition which she is aware of. Neck: No stridor.  No swelling. Cardiovascular: Slightly tachycardic rate, regular rhythm. Grossly normal heart sounds.  Good peripheral circulation. Respiratory: Very slight tachypnea.  Minimal expiratory wheezing.  No retractions. Lungs CTAB.  Patient speaks in full clear sentences normal oxygen saturation on room air. Gastrointestinal: Soft and nontender. No distention. Musculoskeletal: No lower extremity tenderness nor edema. Neurologic:  Normal speech and language. No gross focal neurologic deficits are appreciated.  Skin:  Skin is warm, dry and intact.  Patient does not exhibit any blisters but does have small urticaria primarily on her forearms hands upper body upper trunk  but also some scattered across her lower abdomen.  She reports pruritus. Psychiatric: Mood and affect are slightly anxious but pleasant. Speech and behavior are normal.  ____________________________________________   LABS (all labs ordered are listed, but only abnormal results are displayed)  Labs Reviewed - No data to display ____________________________________________  EKG   ____________________________________________  RADIOLOGY   ____________________________________________   PROCEDURES  Procedure(s) performed: None  Procedures  Critical Care performed: Yes, see critical care note(s)  CRITICAL CARE Performed by: 05/11/21   Total critical care time: 25 minutes  Critical care time was exclusive of separately billable procedures and treating other patients.  Critical care was necessary to treat or prevent imminent or life-threatening deterioration.  Critical care was time spent personally  by me on the following activities: development of treatment plan with patient and/or surrogate as well as nursing, discussions with consultants, evaluation of patient's response to treatment, examination of patient, obtaining history from patient or surrogate, ordering and performing treatments and interventions, ordering and review of laboratory studies, ordering and review of radiographic studies, pulse oximetry and re-evaluation of patient's condition.  Evidence of anaphylaxis with associated urticaria nausea, mild tachycardia, and mild dyspnea.  Discussed with our pharmacist, currently our hospital does not allow administration of IV cetirizine in the patient's age group.  Thus I have ordered IV diphenhydramine. ____________________________________________   INITIAL IMPRESSION / ASSESSMENT AND PLAN / ED COURSE  Pertinent labs & imaging results that were available during my care of the patient were reviewed by me and considered in my medical decision making (see chart for  details).   Symptoms appear consistent with likely an acute allergic reaction.  Precipitating cause unknown.  Did occur when she walked out to her garbage can, question if she could have had an undetected insect bite but again unknown.  Does not take any medications.  Thankfully she shows no evidence of angioedema or airway involvement.  However given slight wheezing mild dyspnea nausea and diffuse urticaria will treat with epinephrine, steroids and histamine.  We will monitor the patient closely.    Clinical Course as of 05/09/21 1614  Mon May 09, 2021  1007 Patient feels improved. Resting without distress, alert. Rash and itching has abataed, dyspnea resolved. [MQ]    Clinical Course User Index [MQ] Sharyn Creamer, MD    ----------------------------------------- 12:50 PM on 05/09/2021 ----------------------------------------- Patient is resting comfortably.  She does did note that she has noticed her right hand is just slightly swollen and itchy.  On exam she has an area consistent with a we will about the size of 1-1/2 cm just proximal to the right third knuckle that is itchy.  Having this information and also she reports she had a similar reaction on Friday when she was outside something bit her around her ankles.  I am thinking that this is likely an insect related allergy, advised patient of this discussed treatment recommendations.  She is awake alert fully oriented reports no ongoing distress just a little bit of itching around the right hand.  Hives have resolved no further wheezing.  Appears appropriate for discharge.  Return precautions and treatment recommendations and follow-up discussed with the patient who is agreeable with the plan.  ____________________________________________   FINAL CLINICAL IMPRESSION(S) / ED DIAGNOSES  Final diagnoses:  Allergic reaction, initial encounter        Note:  This document was prepared using Dragon voice recognition software and may  include unintentional dictation errors       Sharyn Creamer, MD 05/09/21 1615

## 2021-11-23 ENCOUNTER — Encounter: Payer: Self-pay | Admitting: Emergency Medicine

## 2021-11-23 ENCOUNTER — Other Ambulatory Visit: Payer: Self-pay

## 2021-11-23 ENCOUNTER — Emergency Department: Payer: Self-pay

## 2021-11-23 ENCOUNTER — Emergency Department
Admission: EM | Admit: 2021-11-23 | Discharge: 2021-11-23 | Disposition: A | Discharge status: Home / Self Care | Payer: Self-pay | Attending: Emergency Medicine | Admitting: Emergency Medicine

## 2021-11-23 DIAGNOSIS — R112 Nausea with vomiting, unspecified: Secondary | ICD-10-CM

## 2021-11-23 DIAGNOSIS — R000 Tachycardia, unspecified: Secondary | ICD-10-CM | POA: Insufficient documentation

## 2021-11-23 DIAGNOSIS — Z20822 Contact with and (suspected) exposure to covid-19: Secondary | ICD-10-CM | POA: Insufficient documentation

## 2021-11-23 DIAGNOSIS — K529 Noninfective gastroenteritis and colitis, unspecified: Secondary | ICD-10-CM | POA: Insufficient documentation

## 2021-11-23 DIAGNOSIS — R079 Chest pain, unspecified: Secondary | ICD-10-CM

## 2021-11-23 DIAGNOSIS — R0602 Shortness of breath: Secondary | ICD-10-CM | POA: Insufficient documentation

## 2021-11-23 LAB — TROPONIN I (HIGH SENSITIVITY)
Troponin I (High Sensitivity): 31 ng/L — ABNORMAL HIGH (ref <18)
Troponin I (High Sensitivity): 22 ng/L — ABNORMAL HIGH (ref <18)
Troponin I (High Sensitivity): 22 ng/L — ABNORMAL HIGH (ref <18)

## 2021-11-23 LAB — URINALYSIS, ROUTINE W REFLEX MICROSCOPIC
Bilirubin Urine: NEGATIVE
Glucose, UA: NEGATIVE mg/dL
Ketones, ur: NEGATIVE mg/dL
Nitrite: NEGATIVE
Protein, ur: NEGATIVE mg/dL
Specific Gravity, Urine: 1.045 — ABNORMAL HIGH (ref 1.005–1.030)
pH: 5 (ref 5.0–8.0)

## 2021-11-23 LAB — CBC
HCT: 44.6 % (ref 36.0–46.0)
Hemoglobin: 14.5 g/dL (ref 12.0–15.0)
MCH: 31.5 pg (ref 26.0–34.0)
MCHC: 32.5 g/dL (ref 30.0–36.0)
MCV: 96.7 fL (ref 80.0–100.0)
Platelets: 234 10*3/uL (ref 150–400)
RBC: 4.61 MIL/uL (ref 3.87–5.11)
RDW: 12.2 % (ref 11.5–15.5)
WBC: 11.7 10*3/uL — ABNORMAL HIGH (ref 4.0–10.5)
nRBC: 0 % (ref 0.0–0.2)

## 2021-11-23 LAB — BASIC METABOLIC PANEL
Anion gap: 7 (ref 5–15)
BUN: 15 mg/dL (ref 6–20)
CO2: 28 mmol/L (ref 22–32)
Calcium: 9.3 mg/dL (ref 8.9–10.3)
Chloride: 104 mmol/L (ref 98–111)
Creatinine, Ser: 0.99 mg/dL (ref 0.44–1.00)
GFR, Estimated: >60 mL/min (ref >60)
Glucose, Bld: 129 mg/dL — ABNORMAL HIGH (ref 70–99)
Potassium: 4 mmol/L (ref 3.5–5.1)
Sodium: 139 mmol/L (ref 135–145)

## 2021-11-23 LAB — RESP PANEL BY RT-PCR (FLU A&B, COVID) ARPGX2
Influenza A by PCR: NEGATIVE
Influenza B by PCR: NEGATIVE
SARS Coronavirus 2 by RT PCR: NEGATIVE

## 2021-11-23 LAB — HEPATIC FUNCTION PANEL
ALT: 16 U/L (ref 0–44)
AST: 18 U/L (ref 15–41)
Albumin: 4 g/dL (ref 3.5–5.0)
Alkaline Phosphatase: 114 U/L (ref 38–126)
Bilirubin, Direct: <0.1 mg/dL (ref 0.0–0.2)
Total Bilirubin: 0.8 mg/dL (ref 0.3–1.2)
Total Protein: 7.9 g/dL (ref 6.5–8.1)

## 2021-11-23 LAB — LIPASE, BLOOD: Lipase: 34 U/L (ref 11–51)

## 2021-11-23 LAB — BRAIN NATRIURETIC PEPTIDE: B Natriuretic Peptide: 17.5 pg/mL (ref 0.0–100.0)

## 2021-11-23 MED ORDER — SODIUM CHLORIDE 0.9 % IV BOLUS (SEPSIS)
1000.0000 mL | Freq: Once | INTRAVENOUS | Status: AC
  Administered 2021-11-23: 1000 mL via INTRAVENOUS

## 2021-11-23 MED ORDER — PANTOPRAZOLE SODIUM 40 MG PO TBEC: 40.0000 mg | DELAYED_RELEASE_TABLET | Freq: Every day | ORAL | 1 refills | Status: AC

## 2021-11-23 MED ORDER — METOCLOPRAMIDE HCL 5 MG/ML IJ SOLN
10.0000 mg | Freq: Once | INTRAMUSCULAR | Status: AC
Start: 2021-11-23 — End: 2021-11-23
  Administered 2021-11-23: 10 mg via INTRAVENOUS
  Filled 2021-11-23: qty 2

## 2021-11-23 MED ORDER — ONDANSETRON HCL 4 MG/2ML IJ SOLN
4.0000 mg | Freq: Once | INTRAMUSCULAR | Status: AC
Start: 2021-11-23 — End: 2021-11-23
  Administered 2021-11-23: 4 mg via INTRAVENOUS
  Filled 2021-11-23: qty 2

## 2021-11-23 MED ORDER — PANTOPRAZOLE SODIUM 40 MG IV SOLR
40.0000 mg | Freq: Once | INTRAVENOUS | Status: AC
Start: 2021-11-23 — End: 2021-11-23
  Administered 2021-11-23: 40 mg via INTRAVENOUS
  Filled 2021-11-23: qty 10

## 2021-11-23 MED ORDER — DICYCLOMINE HCL 20 MG PO TABS: 20.0000 mg | ORAL_TABLET | Freq: Three times a day (TID) | ORAL | 0 refills | Status: AC | PRN

## 2021-11-23 MED ORDER — IOHEXOL 350 MG/ML SOLN
100.0000 mL | Freq: Once | INTRAVENOUS | Status: AC | PRN
  Administered 2021-11-23: 100 mL via INTRAVENOUS

## 2021-11-23 MED ORDER — DICYCLOMINE HCL 10 MG/ML IM SOLN
20.0000 mg | Freq: Once | INTRAMUSCULAR | Status: AC
  Administered 2021-11-23: 20 mg via INTRAMUSCULAR
  Filled 2021-11-23: qty 2

## 2021-11-23 MED ORDER — NITROGLYCERIN 0.4 MG SL SUBL: 0.4000 mg | SUBLINGUAL_TABLET | SUBLINGUAL | Status: DC | PRN

## 2021-11-23 MED ORDER — METOCLOPRAMIDE HCL 10 MG PO TABS: 10.0000 mg | ORAL_TABLET | Freq: Three times a day (TID) | ORAL | 0 refills | Status: AC | PRN

## 2021-11-23 MED ORDER — ALUM & MAG HYDROXIDE-SIMETH 200-200-20 MG/5ML PO SUSP
30.0000 mL | Freq: Once | ORAL | Status: AC
Start: 2021-11-23 — End: 2021-11-23
  Administered 2021-11-23: 30 mL via ORAL
  Filled 2021-11-23: qty 30

## 2021-11-23 MED ORDER — LIDOCAINE VISCOUS HCL 2 % MT SOLN
15.0000 mL | Freq: Once | OROMUCOSAL | Status: AC
  Administered 2021-11-23: 15 mL via ORAL
  Filled 2021-11-23: qty 15

## 2021-11-23 MED ORDER — ASPIRIN 81 MG PO CHEW
324.0000 mg | CHEWABLE_TABLET | Freq: Once | ORAL | Status: AC
Start: 2021-11-23 — End: 2021-11-23
  Administered 2021-11-23: 324 mg via ORAL
  Filled 2021-11-23: qty 4

## 2021-11-23 MED ORDER — ONDANSETRON 4 MG PO TBDP
4.0000 mg | ORAL_TABLET | Freq: Four times a day (QID) | ORAL | 0 refills | Status: AC | PRN
Start: 2021-11-23 — End: ?

## 2021-11-23 MED ORDER — HYDROMORPHONE HCL 1 MG/ML IJ SOLN
1.0000 mg | Freq: Once | INTRAMUSCULAR | Status: AC
  Administered 2021-11-23: 1 mg via INTRAVENOUS
  Filled 2021-11-23: qty 1

## 2021-11-23 NOTE — ED Triage Notes (Signed)
Pt arrived via POV via with reports of CP that started about 30 mins prior to arrival, states the pain started when she was sitting at the table playing a game, pt states it felt like gas, pt states the pain started in her L chest radiating to L shoulder and down L arm.   Pt describes the pain as sharp.

## 2021-11-23 NOTE — Discharge Instructions (Addendum)
You may alternate Tylenol 1000 mg every 6 hours as needed for pain, fever and Ibuprofen 800 mg every 6-8 hours as needed for pain, fever.  Please take Ibuprofen with food.  Do not take more than 4000 mg of Tylenol (acetaminophen) in a 24 hour period.  You may take over-the-counter Imodium as needed for diarrhea.  Steps to find a Primary Care Provider (PCP):  Call [phone] or [phone] to access "[place] [facility] Service."  2.  You may also go on the [facility] website at [email or web address]

## 2021-11-23 NOTE — ED Provider Notes (Addendum)
[facility] Provider Note    Event Date/Time   First MD Initiated Contact with Patient 11/23/21 [phone]     (approximate)   History   Chest Pain   HPI  Makayla Pena is a 51 y.o. female with history of obesity who presents to the emergency department with severe, sharp left-sided chest pain that radiates up into her neck that started about 45 minutes ago while on the computer.  She states it is causing her to feel short of breath.  No nausea, vomiting, fevers, cough, lower extremity swelling or pain, diaphoresis or dizziness.  She has never had similar symptoms.  Pain is worse with deep inspiration.  No previous history of PE or DVT.  No cardiac history.  She denies abdominal pain.  Has had previous hysterectomy, C-section.   History provided by patient.    Past Medical History:  Diagnosis Date   Ectopic pregnancy     Past Surgical History:  Procedure Laterality Date   ABDOMINAL HYSTERECTOMY     CESAREAN SECTION      MEDICATIONS:  Prior to Admission medications   Medication Sig Start Date End Date Taking? Authorizing Provider  benzonatate (TESSALON) 200 MG capsule Take 1 capsule (200 mg total) by mouth 3 (three) times daily as needed for cough. Patient not taking: Reported on 04/04/2021 11/09/17   Domenick GongMortenson, Ashley, MD  fluticasone [facility](FLONASE) 50 MCG/ACT nasal spray Place 2 sprays into both nostrils daily. Patient not taking: Reported on 04/04/2021 11/09/17   Domenick GongMortenson, Ashley, MD  guaiFENesin-codeine [facility](CHERATUSSIN AC) 100-10 MG/5ML syrup Take 10 mLs by mouth 4 (four) times daily as needed for cough. Patient not taking: Reported on 04/04/2021 11/09/17   Domenick GongMortenson, Ashley, MD  ibuprofen (ADVIL,MOTRIN) 600 MG tablet Take 1 tablet (600 mg total) by mouth every 6 (six) hours as needed. Patient not taking: Reported on 04/04/2021 11/09/17   Domenick GongMortenson, Ashley, MD  loratadine (CLARITIN) 10 MG tablet Take 10 mg by mouth daily.    [provider]  oseltamivir  (TAMIFLU) 75 MG capsule Take 1 capsule (75 mg total) by mouth 2 (two) times daily. X 5 days Patient not taking: Reported on 04/04/2021 11/09/17   Domenick GongMortenson, Ashley, MD  predniSONE (DELTASONE) 50 MG tablet 1 tab by mouth daily 05/09/21   Sharyn CreamerQuale, Mark, MD    Physical Exam   Triage Vital Signs: ED Triage Vitals  Enc Vitals Group     BP 11/23/21 0045 (!) 157/95     Pulse Rate 11/23/21 0045 (!) 105     Resp --      Temp 11/23/21 0045 98.4 F (36.9 C)     Temp Source 11/23/21 0045 Oral     SpO2 11/23/21 0045 99 %     Weight 11/23/21 0044 235 lb (106.6 kg)     Height 11/23/21 0044 5\' 4"  (1.626 m)     Head Circumference --      Peak Flow --      Pain Score 11/23/21 0045 10     Pain Loc --      Pain Edu? --      Excl. in GC? --     Most recent vital signs: Vitals:   11/23/21 0530 11/23/21 0550  BP: (!) 145/80 (!) 143/82  Pulse:  96  Resp: 17 14  Temp:    SpO2:  94%    CONSTITUTIONAL: Alert and oriented and responds appropriately to questions.  Appears uncomfortable.  Tearful. HEAD: Normocephalic, atraumatic EYES: Conjunctivae clear, pupils  appear equal, sclera nonicteric ENT: normal nose; moist mucous membranes NECK: Supple, normal ROM CARD: Regular and tachycardic; S1 and S2 appreciated; no murmurs, no clicks, no rubs, no gallops CHEST:  Chest wall is nontender to palpation.  No crepitus, ecchymosis, erythema, warmth, rash or other lesions present.   RESP: Patient is splinting and intermittently will be hypoxic to 89% but then will come back up quickly to 99% on room air.  Her lungs are clear to auscultation with good aeration and no rhonchi, wheezing or rales. ABD/GI: Normal bowel sounds; non-distended; soft, non-tender, no rebound, no guarding, no peritoneal signs BACK: The back appears normal EXT: Normal ROM in all joints; no deformity noted, no edema; no cyanosis, no calf tenderness or calf swelling SKIN: Normal color for age and race; warm; no rash on exposed skin NEURO:  Moves all extremities equally, normal speech PSYCH: The patient's mood and manner are appropriate.   ED Results / Procedures / Treatments   LABS: (all labs ordered are listed, but only abnormal results are displayed) Labs Reviewed  BASIC METABOLIC PANEL - Abnormal; Notable for the following components:      Result Value   Glucose, Bld 129 (*)    All other components within normal limits  CBC - Abnormal; Notable for the following components:   WBC 11.7 (*)    All other components within normal limits  URINALYSIS, ROUTINE W REFLEX MICROSCOPIC - Abnormal; Notable for the following components:   Color, Urine YELLOW (*)    APPearance CLEAR (*)    Specific Gravity, Urine 1.045 (*)    Hgb urine dipstick SMALL (*)    Leukocytes,Ua TRACE (*)    Bacteria, UA MANY (*)    All other components within normal limits  TROPONIN I (HIGH SENSITIVITY) - Abnormal; Notable for the following components:   Troponin I (High Sensitivity) 31 (*)    All other components within normal limits  TROPONIN I (HIGH SENSITIVITY) - Abnormal; Notable for the following components:   Troponin I (High Sensitivity) 22 (*)    All other components within normal limits  TROPONIN I (HIGH SENSITIVITY) - Abnormal; Notable for the following components:   Troponin I (High Sensitivity) 22 (*)    All other components within normal limits  RESP PANEL BY RT-PCR (FLU A&B, COVID) [id number]  HEPATIC FUNCTION PANEL  LIPASE, BLOOD  BRAIN NATRIURETIC PEPTIDE     EKG:  EKG Interpretation  Date/Time:  Wednesday November 23 2021 00:41:05 EST Ventricular Rate:  102 PR Interval:    QRS Duration: 70 QT Interval:  358 QTC Calculation: 466 R Axis:   10 Text Interpretation: Atrial flutter with variable A-V block versus artifact Nonspecific T wave abnormality Abnormal ECG When compared with ECG of 10-Apr-2014 17:51, Atrial flutter has replaced Sinus rhythm Nonspecific T wave abnormality now evident in Lateral leads Reconfirmed by Sonna Lipsky,  Baxter Hire [phone]) on 11/23/2021 12:55:58 AM          Date: 11/23/2021 1:35 AM  Rate: 85  Rhythm: normal sinus rhythm  QRS Axis: normal  Intervals: normal  ST/T Wave abnormalities: normal  Conduction Disutrbances: none  Narrative Interpretation: unremarkable, lateral nonspecific T wave flattening     RADIOLOGY: My personal review and interpretation of imaging: Chest x-ray clear.  A CTA of the chest shows no PE.  CT of the abdomen and pelvis shows no bowel obstruction, appendicitis.  I have personally reviewed all radiology reports.   DG Chest 1 View  Result Date: 11/23/2021 CLINICAL DATA:  Chest  pain EXAM: CHEST  1 VIEW COMPARISON:  04/10/2014 FINDINGS: Lungs volumes are small, but are symmetric and are clear. No pneumothorax or pleural effusion. Cardiac size within normal limits. Pulmonary vascularity is normal. Osseous structures are age-appropriate. No acute bone abnormality. IMPRESSION: No active disease. Electronically Signed   By: Helyn Numbers M.D.   On: 11/23/2021 01:04   CT Angio Chest PE W and/or Wo Contrast  Result Date: 11/23/2021 CLINICAL DATA:  Chest pain. Pulmonary embolism (PE) suspected, unknown D-dimer. EXAM: CT ANGIOGRAPHY CHEST WITH CONTRAST TECHNIQUE: Multidetector CT imaging of the chest was performed using the standard protocol during bolus administration of intravenous contrast. Multiplanar CT image reconstructions and MIPs were obtained to evaluate the vascular anatomy. RADIATION DOSE REDUCTION: This exam was performed according to the departmental dose-optimization program which includes automated exposure control, adjustment of the mA and/or kV according to patient size and/or use of iterative reconstruction technique. CONTRAST:  OMNIPAQUE IOHEXOL 350 MG/ML SOLN COMPARISON:  None. FINDINGS: Cardiovascular: Adequate opacification of the pulmonary arterial tree. No intraluminal filling defect identified to suggest acute pulmonary embolism. Central pulmonary  arteries are of normal caliber. No significant coronary artery calcification. Cardiac size within normal limits. No pericardial effusion. No significant atherosclerotic calcification within the thoracic aorta. No aortic aneurysm. Aberrant origin of the right subclavian artery noted. Mediastinum/Nodes: No enlarged mediastinal, hilar, or axillary lymph nodes. Thyroid gland, trachea, and esophagus demonstrate no significant findings. Small hiatal hernia. Lungs/Pleura: Mild bibasilar atelectasis. No confluent pulmonary infiltrate. No pneumothorax or pleural effusion. Central airways are widely patent. Upper Abdomen: No acute abnormality. Musculoskeletal: No chest wall abnormality. No acute or significant osseous findings. Review of the MIP images confirms the above findings. IMPRESSION: No pulmonary embolism.  No acute intrathoracic pathology identified. Electronically Signed   By: Helyn Numbers M.D.   On: 11/23/2021 03:07   CT ABDOMEN PELVIS W CONTRAST  Result Date: 11/23/2021 CLINICAL DATA:  Abdominal pain. EXAM: CT ABDOMEN AND PELVIS WITH CONTRAST TECHNIQUE: Multidetector CT imaging of the abdomen and pelvis was performed using the standard protocol following bolus administration of intravenous contrast. RADIATION DOSE REDUCTION: This exam was performed according to the departmental dose-optimization program which includes automated exposure control, adjustment of the mA and/or kV according to patient size and/or use of iterative reconstruction technique. CONTRAST:  OMNIPAQUE IOHEXOL 350 MG/ML SOLN COMPARISON:  None. FINDINGS: Lower chest: There are linear and patchy hazy opacities in the basal segments of the lower lobes, lingular base, which could be due to atelectasis, pneumonia or combination. Remaining lung bases are clear. There is mild cardiomegaly. Small hiatal hernia. No pericardial effusion. Trace pleural effusions. Hepatobiliary: No focal liver abnormality is seen. No gallstones, gallbladder wall  thickening, or biliary dilatation. Pancreas: Unremarkable. No pancreatic ductal dilatation or surrounding inflammatory changes. Spleen: No mass enhancement or splenomegaly. Adrenals/Urinary Tract: There is no adrenal mass. There are 2 caliceal stones each measuring 4 mm in the superior pole of the right kidney. No intrarenal stones seen on left no obstructing stones bilaterally. No focal abnormality is seen in the renal cortex and there is no bladder thickening. Stomach/Bowel: The gastric wall is unremarkable. There is jejunal fold thickening without inflammatory change. No small bowel dilatation or inflammation is seen. There is a normal appendix. Moderate fecal stasis ascending and transverse colon. There are left colonic diverticula without evidence of acute diverticulitis. Vascular/Lymphatic: No significant vascular findings are present. No enlarged abdominal or pelvic lymph nodes. Reproductive: Status post hysterectomy. No adnexal masses. Other: There is  a small umbilical fat hernia. There is no free air, hemorrhage or fluid. Musculoskeletal: There is facet hypertrophy in the lower lumbar spine, most advanced at L4-5 and L5-S1. 1.1 cm sclerotic lesion in the right hemisacrum is noted most likely representing a bone island. There are mild features of chronic right sacroiliitis. IMPRESSION: 1. Opacities in the lower lobes, lingular base which could be due to atelectasis or pneumonia. Trace pleural effusions. Edema component not strictly excluded but doubtful. 2. Cardiomegaly. 3. 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 4. Constipation and diverticulosis. 5. Nonobstructive right-sided nephrolithiasis. 6. Small umbilical fat hernia. 7. 1.1 cm probable bone island in the right hemisacrum. 8. Small hiatal hernia. Electronically Signed   By: Almira Bar M.D.   On: 11/23/2021 03:20     PROCEDURES:  Critical Care performed:  No     .1-3 Lead EKG Interpretation Performed by: Blenda Wisecup, Layla Maw, DO Authorized by: Keatyn Luck, Layla Maw, DO     Interpretation: normal     ECG rate:  107   ECG rate assessment: normal     Rhythm: sinus rhythm     Ectopy: none     Conduction: normal      IMPRESSION / MDM / ASSESSMENT AND PLAN / ED COURSE  I reviewed the triage vital signs and the nursing notes.   Patient here with sudden onset sharp left-sided chest pain.  The patient is on the cardiac monitor to evaluate for evidence of arrhythmia and/or significant heart rate changes.   DIFFERENTIAL DIAGNOSIS (includes but not limited to):   PE, dissection, ACS, pneumothorax, pneumonia, musculoskeletal chest pain, pancreatitis, cholelithiasis, cholecystitis, GERD, esophageal spasm, esophageal rupture   PLAN: We will obtain CBC, CMP, lipase, troponin.  We will obtain a CT of the chest.  Will give Dilaudid, Zofran for symptomatic relief.  Initial EKG shows atrial flutter versus artifact but on cardiac monitoring she appears to be in a sinus tachycardia.  Will repeat EKG.   MEDICATIONS GIVEN IN ED: Medications  HYDROmorphone (DILAUDID) injection 1 mg (1 mg Intravenous Given 11/23/21 0117)  ondansetron (ZOFRAN) injection 4 mg (4 mg Intravenous Given 11/23/21 0117)  iohexol (OMNIPAQUE) 350 MG/ML injection 100 mL (100 mLs Intravenous Contrast Given 11/23/21 0233)  aspirin chewable tablet 324 mg (324 mg Oral Given 11/23/21 0323)  dicyclomine (BENTYL) injection 20 mg (20 mg Intramuscular Given 11/23/21 0442)  alum & mag hydroxide-simeth (MAALOX/MYLANTA) 200-200-20 MG/5ML suspension 30 mL (30 mLs Oral Given 11/23/21 0359)    And  lidocaine (XYLOCAINE) 2 % viscous mouth solution 15 mL (15 mLs Oral Given 11/23/21 0359)  pantoprazole (PROTONIX) injection 40 mg (40 mg Intravenous Given 11/23/21 0400)  sodium chloride 0.9 % bolus 1,000 mL (0 mLs Intravenous Stopped 11/23/21 0551)  metoCLOPramide (REGLAN) injection 10 mg (10 mg Intravenous Given 11/23/21  0442)  alum & mag hydroxide-simeth (MAALOX/MYLANTA) 200-200-20 MG/5ML suspension 30 mL (30 mLs Oral Given 11/23/21 0442)     ED COURSE:  2:39 AM  Pt's labs show mild leukocytosis.  Normal electrolytes, LFTs, lipase, BNP.  Troponin minimally elevated at 31 but repeat EKG does not show any ST elevation.  There is nonspecific T wave flattening in lateral leads.  She is in a sinus rhythm.  Once her creatinine resulted, patient was taken immediately to CT scan.  Plan was to get a CTA of her chest to evaluate for PE but also to look for dissection.  Called by the radiology technician because patient also complaining  of abdominal pain now and vomiting which she denied earlier.  We will also obtain a CT of her abdomen pelvis.  3:35 AM  Pt's repeat troponin is downtrending to 22.  CT of the chest as well as abdomen pelvis reviewed by myself and radiologist and shows no PE, infiltrate, edema, dissection.  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  I suspect that this is the cause of her pain and vomiting today.  She is still complaining of some discomfort.  Will give Bentyl, GI cocktail, protonix and p.o. challenge.  She denies any known sick contacts.  4:03 AM  Pt began vomiting again after GI cocktail and p.o. challenge.  Will give Reglan and reassess.  5:37 AM  Pt's third troponin is stable at 22.  Low suspicion now that this is ACS.  Patient reports feeling better after Reglan, Bentyl and repeat Maalox.  Given crackers and water and will reassess to ensure she can tolerate p.o.  5:56 AM  Pt able to tolerate drinking water and eating crackers.  States she is still feeling better and is comfortable with plan for discharge home.  Will discharge with prescriptions of Reglan, Zofran, Protonix, Bentyl for home.  Recommended a bland diet.  Discussed with patient that I suspect that this is a viral gastroenteritis causing her symptoms.  Discussed return precautions.  She verbalized understanding.  COVID and  flu swabs negative.  At this time, I do not feel there is any life-threatening condition present. I reviewed all nursing notes, vitals, pertinent previous records.  All lab and urine results, EKGs, imaging ordered have been independently reviewed and interpreted by myself.  I reviewed all available radiology reports from any imaging ordered this visit.  Based on my assessment, I feel the patient is safe to be discharged home without further emergent workup and can continue workup as an outpatient as needed. Discussed all findings, treatment plan as well as usual and customary return precautions with patient.  They verbalize understanding and are comfortable with this plan.  Outpatient follow-up has been provided as needed.  All questions have been answered.    CONSULTS: No admission needed at this time given downtrending troponins.   OUTSIDE RECORDS REVIEWED: Reviewed patient's last primary care doctor visit in May 2017.  At that time patient had not been seen by a physician for 10 years.  Does not currently have a primary care doctor due to lack of insurance.         FINAL CLINICAL IMPRESSION(S) / ED DIAGNOSES   Final diagnoses:  Nonspecific chest pain  Enteritis  Nausea and vomiting in adult     Rx / DC Orders   ED Discharge Orders          Ordered    ondansetron (ZOFRAN-ODT) 4 MG disintegrating tablet  Every 6 hours PRN        11/23/21 0539    dicyclomine (BENTYL) 20 MG tablet  Every 8 hours PRN        11/23/21 0539    pantoprazole (PROTONIX) 40 MG tablet  Daily        11/23/21 0539    metoCLOPramide (REGLAN) 10 MG tablet  Every 8 hours PRN        11/23/21 0555             Note:  This document was prepared using Dragon voice recognition software and may include unintentional dictation errors.   Limmie Schoenberg, Layla MawKristen N, DO 11/23/21 [id number]    Neasia Fleeman, Layla MawKristen N,  DO 11/23/21 0559

## 2022-08-29 IMAGING — CT CT ABD-PELV W/ CM
2 of 5 series · 14 of 46 positions shown, 16 images · IV contrast (APPLIED)
Comparison: None.

CLINICAL DATA: Abdominal pain.

EXAM:
CT ABDOMEN AND PELVIS WITH CONTRAST
TECHNIQUE: Multidetector CT imaging of the abdomen and pelvis was performed
using the standard protocol following bolus administration of
intravenous contrast.

[Series 2: axial st · axial · 0.78mm/px · z∈[-789,-389]mm · 11 of 98 slices shown, 13 images]
[im 9/98  soft-tissue]
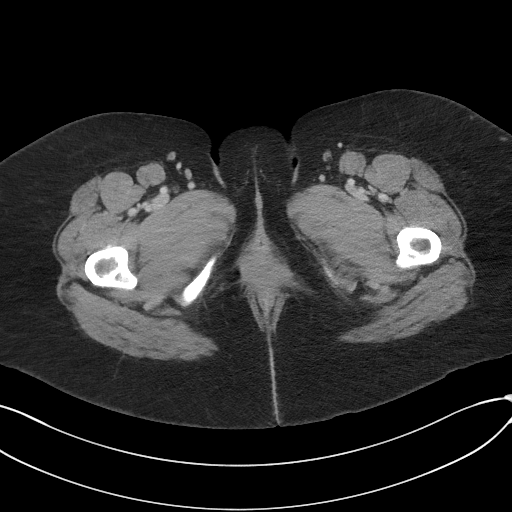
[im 9/98  bone]
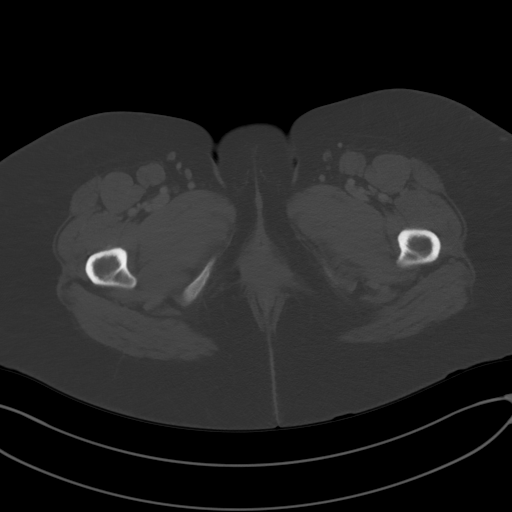
[im 17/98  soft-tissue]
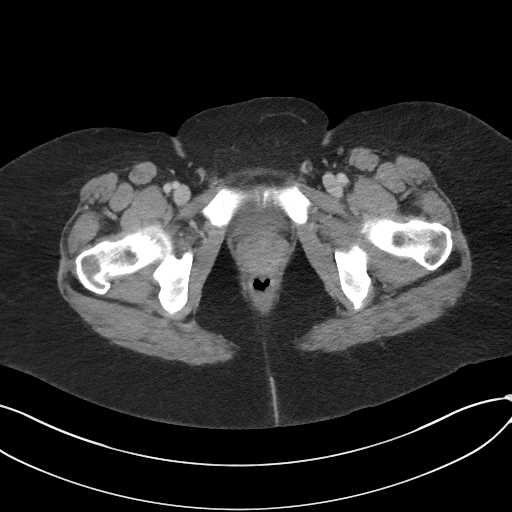
[im 25/98  soft-tissue]
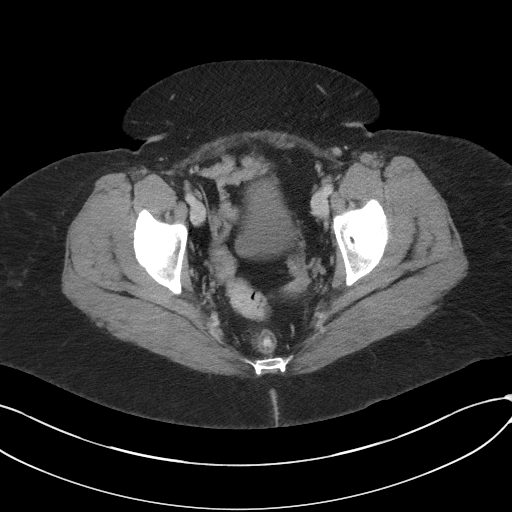
[im 33/98  soft-tissue]
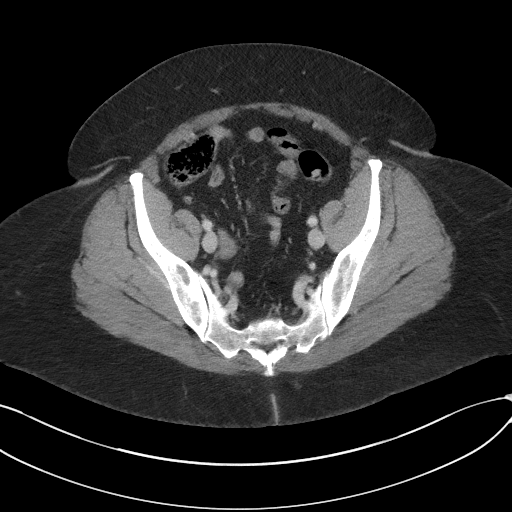
[im 41/98  soft-tissue]
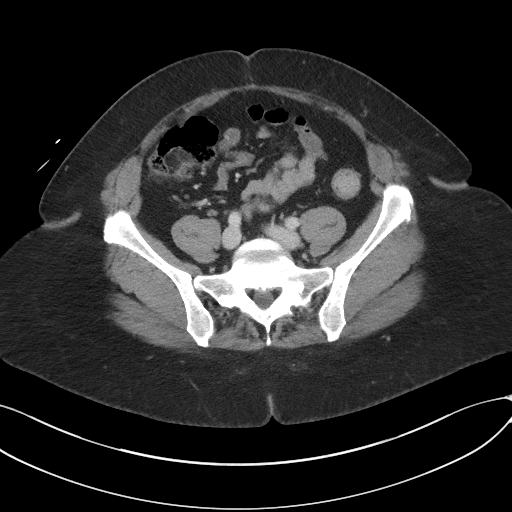
[im 49/98  soft-tissue]
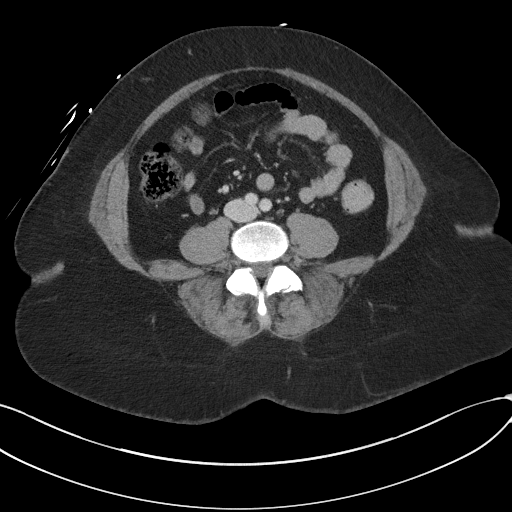
[im 57/98  soft-tissue]
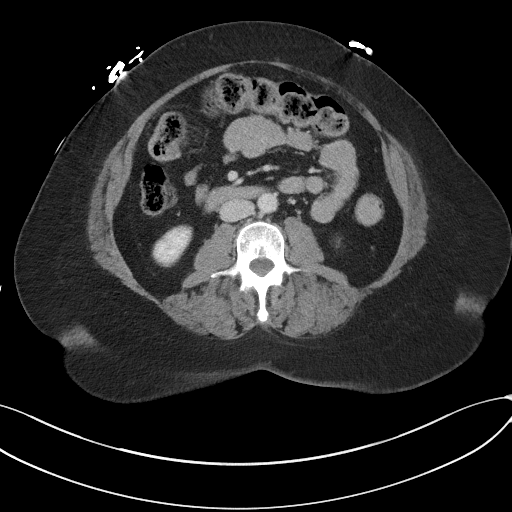
[im 65/98  soft-tissue]
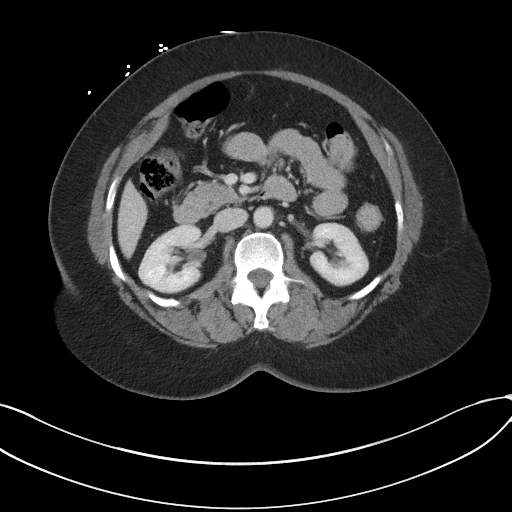
[im 73/98  soft-tissue]
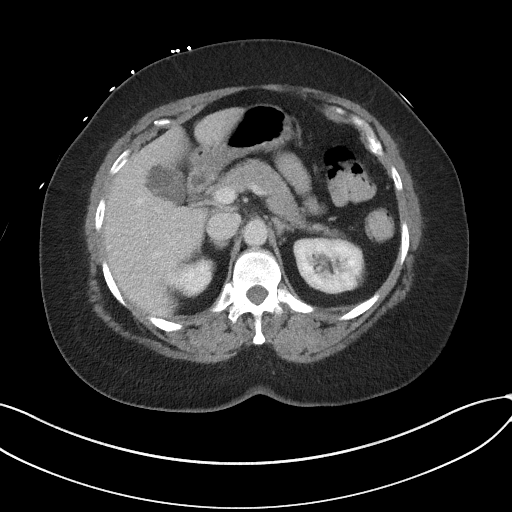
[im 73/98  bone]
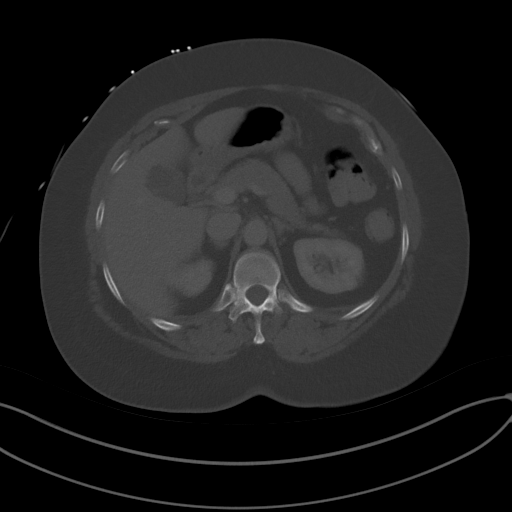
[im 81/98  soft-tissue]
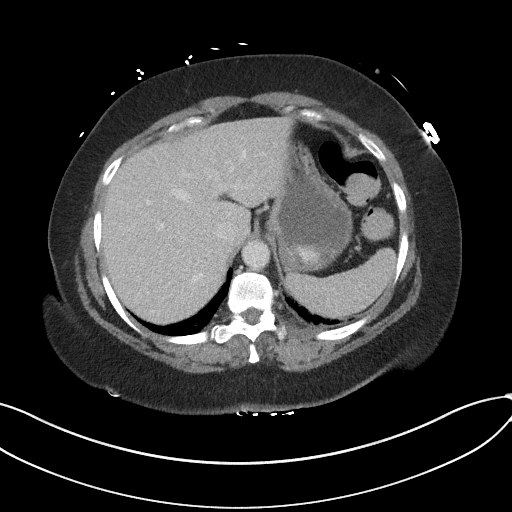
[im 89/98  soft-tissue]
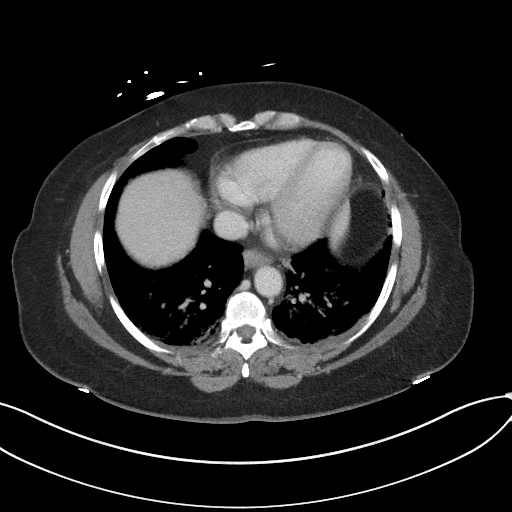

[Series 5: coronal st · coronal · 0.60mm/px · 3 of 95 slices shown]
[im 32/95  soft-tissue]
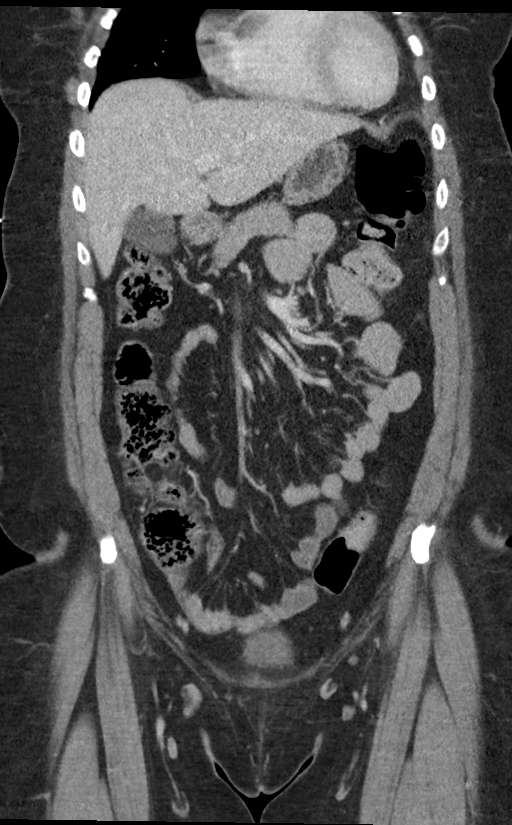
[im 42/95  soft-tissue]
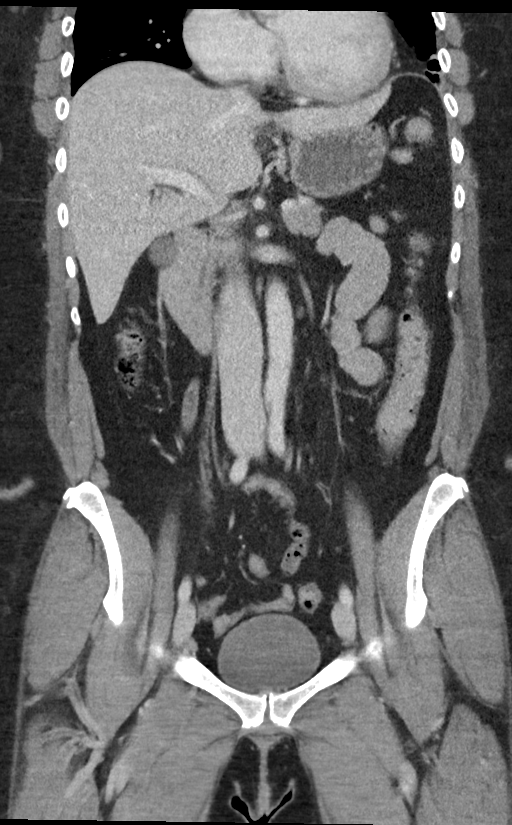
[im 53/95  soft-tissue]
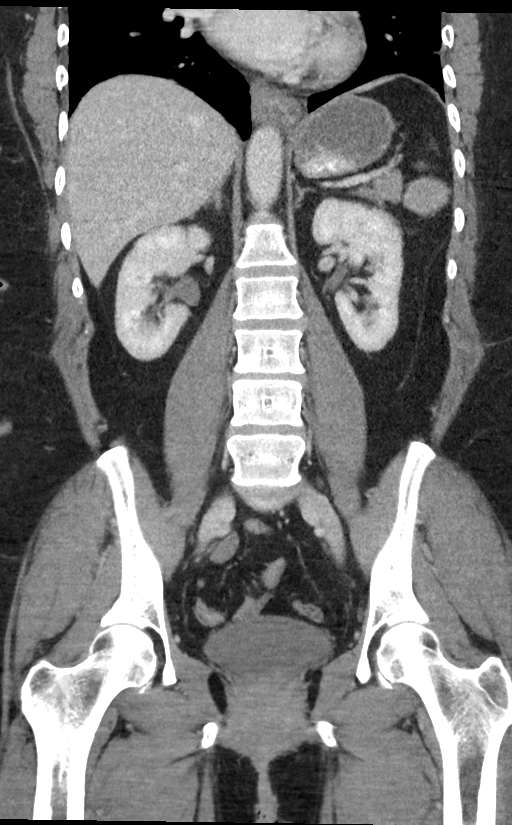

[14 of 46 positions shown; findings below may reference images not displayed]

RADIATION DOSE REDUCTION: This exam was performed according to the
departmental dose-optimization program which includes automated
exposure control, adjustment of the mA and/or kV according to
patient size and/or use of iterative reconstruction technique.

CONTRAST:  100mL OMNIPAQUE IOHEXOL 350 MG/ML SOLN
FINDINGS: Lower chest: There are linear and patchy hazy opacities in the basal
segments of the lower lobes, lingular base, which could be due to
atelectasis, pneumonia or combination.

Remaining lung bases are clear. There is mild cardiomegaly. Small
hiatal hernia. No pericardial effusion. Trace pleural effusions.

Hepatobiliary: No focal liver abnormality is seen. No gallstones,
gallbladder wall thickening, or biliary dilatation.

Pancreas: Unremarkable. No pancreatic ductal dilatation or
surrounding inflammatory changes.

Spleen: No mass enhancement or splenomegaly.

Adrenals/Urinary Tract: There is no adrenal mass. There are 2
caliceal stones each measuring 4 mm in the superior pole of the
right kidney. No intrarenal stones seen on left no obstructing
stones bilaterally.

No focal abnormality is seen in the renal cortex and there is no
bladder thickening.

Stomach/Bowel: The gastric wall is unremarkable. There is jejunal
fold thickening without inflammatory change. No small bowel
dilatation or inflammation is seen. There is a normal appendix.
Moderate fecal stasis ascending and transverse colon. There are left
colonic diverticula without evidence of acute diverticulitis.

Vascular/Lymphatic: No significant vascular findings are present. No
enlarged abdominal or pelvic lymph nodes.

Reproductive: Status post hysterectomy. No adnexal masses.

Other: There is a small umbilical fat hernia. There is no free air,
hemorrhage or fluid.

Musculoskeletal: There is facet hypertrophy in the lower lumbar
spine, most advanced at L4-5 and L5-S1. 1.1 cm sclerotic lesion in
the right hemisacrum is noted most likely representing a bone
island. There are mild features of chronic right sacroiliitis.
IMPRESSION: 1. Opacities in the lower lobes, lingular base which could be due to
atelectasis or pneumonia. Trace pleural effusions. Edema component
not strictly excluded but doubtful.
2. Cardiomegaly.
3. Evidence for nonspecific jejunal enteritis or enteropathy ,
without inflammatory changes or small-bowel obstruction.
4. Constipation and diverticulosis.
5. Nonobstructive right-sided nephrolithiasis.
6. Small umbilical fat hernia.
7. 1.1 cm probable bone island in the right hemisacrum.
8. Small hiatal hernia.

## 2023-10-02 ENCOUNTER — Other Ambulatory Visit: Payer: Self-pay

## 2023-10-02 ENCOUNTER — Emergency Department
Admission: EM | Admit: 2023-10-02 | Discharge: 2023-10-02 | Discharge status: Against Medical Advice | Payer: Self-pay | Attending: Emergency Medicine | Admitting: Emergency Medicine

## 2023-10-02 DIAGNOSIS — R55 Syncope and collapse: Secondary | ICD-10-CM | POA: Insufficient documentation

## 2023-10-02 DIAGNOSIS — R61 Generalized hyperhidrosis: Secondary | ICD-10-CM | POA: Insufficient documentation

## 2023-10-02 DIAGNOSIS — Z5321 Procedure and treatment not carried out due to patient leaving prior to being seen by health care provider: Secondary | ICD-10-CM | POA: Insufficient documentation

## 2023-10-02 LAB — BASIC METABOLIC PANEL
Anion gap: 10 (ref 5–15)
BUN: 12 mg/dL (ref 6–20)
CO2: 24 mmol/L (ref 22–32)
Calcium: 8.7 mg/dL — ABNORMAL LOW (ref 8.9–10.3)
Chloride: 104 mmol/L (ref 98–111)
Creatinine, Ser: 1.1 mg/dL — ABNORMAL HIGH (ref 0.44–1.00)
GFR, Estimated: >60 mL/min (ref >60)
Glucose, Bld: 134 mg/dL — ABNORMAL HIGH (ref 70–99)
Potassium: 3.4 mmol/L — ABNORMAL LOW (ref 3.5–5.1)
Sodium: 138 mmol/L (ref 135–145)

## 2023-10-02 LAB — CBC
HCT: 44.5 % (ref 36.0–46.0)
Hemoglobin: 15 g/dL (ref 12.0–15.0)
MCH: 31.4 pg (ref 26.0–34.0)
MCHC: 33.7 g/dL (ref 30.0–36.0)
MCV: 93.3 fL (ref 80.0–100.0)
Platelets: 203 10*3/uL (ref 150–400)
RBC: 4.77 MIL/uL (ref 3.87–5.11)
RDW: 11.9 % (ref 11.5–15.5)
WBC: 7.1 10*3/uL (ref 4.0–10.5)
nRBC: 0 % (ref 0.0–0.2)

## 2023-10-02 NOTE — ED Triage Notes (Addendum)
Pt to ED via [facility] from work. 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 EMS reports pt diaphoretic on arrival and BP was ranging between 108-200 systolic. EMS reports multiple life stressors recently. Pt denies CP, SOB, or HA.   EMS VS: HR 100 CBG 136 RR 16 100% RA

## 2023-10-02 NOTE — ED Notes (Signed)
Pt called for vitals recheck with no answer 

## 2023-10-02 NOTE — ED Provider Triage Note (Signed)
Emergency Medicine Provider Triage Evaluation Note  Makayla Pena , a 52 y.o. female  was evaluated in triage.  Pt complains of syncope at [employer] while working. Denies CP or difficulty breathing.  No prior episodes.    Review of Systems  Positive: Syncope, stress+ Negative: No CP, nausea, illness  Physical Exam  BP 105/80   Pulse (!) 110   Temp 98 F (36.7 C) (Oral)   Resp 20   SpO2 100%  Gen:   Awake, no distress  Alert, talkative. Resp:  Normal effort   Lungs clear bilat MSK:   Moves extremities without difficulty  Other:    Medical Decision Making  Medically screening exam initiated at 10:31 AM.  Appropriate orders placed.  Earlie Counts was informed that the remainder of the evaluation will be completed by another provider, this initial triage assessment does not replace that evaluation, and the importance of remaining in the ED until their evaluation is complete.     Tommi Rumps, PA-C 10/02/23 1034
